# Patient Record
Sex: Female | Born: 1940 | Race: Black or African American | Hispanic: No | Smoking: Never smoker
Health system: Southern US, Community
[De-identification: ages and names within clinical notes are randomized; demographics above are authoritative.]

## PROBLEM LIST (undated history)

## (undated) DIAGNOSIS — G43909 Migraine, unspecified, not intractable, without status migrainosus: Secondary | ICD-10-CM

## (undated) DIAGNOSIS — E785 Hyperlipidemia, unspecified: Secondary | ICD-10-CM

## (undated) DIAGNOSIS — G4733 Obstructive sleep apnea (adult) (pediatric): Secondary | ICD-10-CM

## (undated) DIAGNOSIS — N183 Chronic kidney disease, stage 3 unspecified: Secondary | ICD-10-CM

## (undated) DIAGNOSIS — I10 Essential (primary) hypertension: Secondary | ICD-10-CM

## (undated) DIAGNOSIS — M109 Gout, unspecified: Secondary | ICD-10-CM

## (undated) DIAGNOSIS — D649 Anemia, unspecified: Secondary | ICD-10-CM

## (undated) DIAGNOSIS — G459 Transient cerebral ischemic attack, unspecified: Secondary | ICD-10-CM

## (undated) DIAGNOSIS — E119 Type 2 diabetes mellitus without complications: Secondary | ICD-10-CM

## (undated) DIAGNOSIS — E669 Obesity, unspecified: Secondary | ICD-10-CM

## (undated) DIAGNOSIS — M1990 Unspecified osteoarthritis, unspecified site: Secondary | ICD-10-CM

## (undated) DIAGNOSIS — G4730 Sleep apnea, unspecified: Secondary | ICD-10-CM

## (undated) DIAGNOSIS — F32A Depression, unspecified: Secondary | ICD-10-CM

## (undated) DIAGNOSIS — F329 Major depressive disorder, single episode, unspecified: Secondary | ICD-10-CM

## (undated) DIAGNOSIS — N289 Disorder of kidney and ureter, unspecified: Secondary | ICD-10-CM

## (undated) DIAGNOSIS — K219 Gastro-esophageal reflux disease without esophagitis: Secondary | ICD-10-CM

## (undated) DIAGNOSIS — I509 Heart failure, unspecified: Secondary | ICD-10-CM

## (undated) HISTORY — PX: ABDOMINAL HYSTERECTOMY: SHX81

## (undated) HISTORY — PX: JOINT REPLACEMENT: SHX530

## (undated) HISTORY — PX: REPLACEMENT TOTAL KNEE BILATERAL: SUR1225

## (undated) HISTORY — PX: CHOLECYSTECTOMY: SHX55

---

## 2012-05-29 ENCOUNTER — Emergency Department (HOSPITAL_BASED_OUTPATIENT_CLINIC_OR_DEPARTMENT_OTHER): Payer: Medicare Other

## 2012-05-29 ENCOUNTER — Emergency Department (HOSPITAL_BASED_OUTPATIENT_CLINIC_OR_DEPARTMENT_OTHER)
Admission: EM | Admit: 2012-05-29 | Discharge: 2012-05-29 | Disposition: A | Discharge status: Home / Self Care | Payer: Medicare Other | Attending: Emergency Medicine | Admitting: Emergency Medicine

## 2012-05-29 ENCOUNTER — Encounter (HOSPITAL_BASED_OUTPATIENT_CLINIC_OR_DEPARTMENT_OTHER): Payer: Self-pay | Admitting: Family Medicine

## 2012-05-29 DIAGNOSIS — F329 Major depressive disorder, single episode, unspecified: Secondary | ICD-10-CM | POA: Insufficient documentation

## 2012-05-29 DIAGNOSIS — Z79899 Other long term (current) drug therapy: Secondary | ICD-10-CM | POA: Insufficient documentation

## 2012-05-29 DIAGNOSIS — I129 Hypertensive chronic kidney disease with stage 1 through stage 4 chronic kidney disease, or unspecified chronic kidney disease: Secondary | ICD-10-CM | POA: Insufficient documentation

## 2012-05-29 DIAGNOSIS — M109 Gout, unspecified: Secondary | ICD-10-CM | POA: Insufficient documentation

## 2012-05-29 DIAGNOSIS — I509 Heart failure, unspecified: Secondary | ICD-10-CM | POA: Insufficient documentation

## 2012-05-29 DIAGNOSIS — E669 Obesity, unspecified: Secondary | ICD-10-CM | POA: Insufficient documentation

## 2012-05-29 DIAGNOSIS — F3289 Other specified depressive episodes: Secondary | ICD-10-CM | POA: Insufficient documentation

## 2012-05-29 DIAGNOSIS — Z8739 Personal history of other diseases of the musculoskeletal system and connective tissue: Secondary | ICD-10-CM | POA: Insufficient documentation

## 2012-05-29 DIAGNOSIS — Z8719 Personal history of other diseases of the digestive system: Secondary | ICD-10-CM | POA: Insufficient documentation

## 2012-05-29 DIAGNOSIS — J159 Unspecified bacterial pneumonia: Secondary | ICD-10-CM | POA: Insufficient documentation

## 2012-05-29 DIAGNOSIS — Z87448 Personal history of other diseases of urinary system: Secondary | ICD-10-CM | POA: Insufficient documentation

## 2012-05-29 DIAGNOSIS — D649 Anemia, unspecified: Secondary | ICD-10-CM | POA: Insufficient documentation

## 2012-05-29 DIAGNOSIS — R0789 Other chest pain: Secondary | ICD-10-CM | POA: Insufficient documentation

## 2012-05-29 DIAGNOSIS — Z8673 Personal history of transient ischemic attack (TIA), and cerebral infarction without residual deficits: Secondary | ICD-10-CM | POA: Insufficient documentation

## 2012-05-29 DIAGNOSIS — N183 Chronic kidney disease, stage 3 unspecified: Secondary | ICD-10-CM | POA: Insufficient documentation

## 2012-05-29 DIAGNOSIS — E119 Type 2 diabetes mellitus without complications: Secondary | ICD-10-CM | POA: Insufficient documentation

## 2012-05-29 DIAGNOSIS — Z8669 Personal history of other diseases of the nervous system and sense organs: Secondary | ICD-10-CM | POA: Insufficient documentation

## 2012-05-29 DIAGNOSIS — J189 Pneumonia, unspecified organism: Secondary | ICD-10-CM

## 2012-05-29 DIAGNOSIS — R0602 Shortness of breath: Secondary | ICD-10-CM | POA: Insufficient documentation

## 2012-05-29 HISTORY — DX: Major depressive disorder, single episode, unspecified: F32.9

## 2012-05-29 HISTORY — DX: Gastro-esophageal reflux disease without esophagitis: K21.9

## 2012-05-29 HISTORY — DX: Depression, unspecified: F32.A

## 2012-05-29 HISTORY — DX: Disorder of kidney and ureter, unspecified: N28.9

## 2012-05-29 HISTORY — DX: Heart failure, unspecified: I50.9

## 2012-05-29 HISTORY — DX: Unspecified osteoarthritis, unspecified site: M19.90

## 2012-05-29 HISTORY — DX: Essential (primary) hypertension: I10

## 2012-05-29 HISTORY — DX: Obesity, unspecified: E66.9

## 2012-05-29 HISTORY — DX: Type 2 diabetes mellitus without complications: E11.9

## 2012-05-29 HISTORY — DX: Anemia, unspecified: D64.9

## 2012-05-29 HISTORY — DX: Gout, unspecified: M10.9

## 2012-05-29 HISTORY — DX: Chronic kidney disease, stage 3 unspecified: N18.30

## 2012-05-29 HISTORY — DX: Migraine, unspecified, not intractable, without status migrainosus: G43.909

## 2012-05-29 HISTORY — DX: Chronic kidney disease, stage 3 (moderate): N18.3

## 2012-05-29 HISTORY — DX: Obstructive sleep apnea (adult) (pediatric): G47.33

## 2012-05-29 HISTORY — DX: Sleep apnea, unspecified: G47.30

## 2012-05-29 HISTORY — DX: Transient cerebral ischemic attack, unspecified: G45.9

## 2012-05-29 LAB — CBC WITH DIFFERENTIAL/PLATELET
Basophils Relative: 0 % (ref 0–1)
Eosinophils Absolute: 0.2 10*3/uL (ref 0.0–0.7)
Eosinophils Relative: 3 % (ref 0–5)
Hemoglobin: 10.5 g/dL — ABNORMAL LOW (ref 12.0–15.0)
Lymphs Abs: 1.5 10*3/uL (ref 0.7–4.0)
MCH: 29.2 pg (ref 26.0–34.0)
MCHC: 31.8 g/dL (ref 30.0–36.0)
MCV: 91.7 fL (ref 78.0–100.0)
Monocytes Relative: 12 % (ref 3–12)
Neutrophils Relative %: 62 % (ref 43–77)
Platelets: 219 10*3/uL (ref 150–400)
RBC: 3.6 MIL/uL — ABNORMAL LOW (ref 3.87–5.11)

## 2012-05-29 LAB — COMPREHENSIVE METABOLIC PANEL
Albumin: 3.5 g/dL (ref 3.5–5.2)
BUN: 22 mg/dL (ref 6–23)
Calcium: 9.1 mg/dL (ref 8.4–10.5)
GFR calc Af Amer: 39 mL/min — ABNORMAL LOW (ref >90)
Glucose, Bld: 167 mg/dL — ABNORMAL HIGH (ref 70–99)
Potassium: 4.4 mEq/L (ref 3.5–5.1)
Total Protein: 7.5 g/dL (ref 6.0–8.3)

## 2012-05-29 LAB — POCT I-STAT 3, ART BLOOD GAS (G3+)
pCO2 arterial: 41.3 mmHg (ref 35.0–45.0)
pH, Arterial: 7.392 (ref 7.350–7.450)

## 2012-05-29 LAB — GLUCOSE, CAPILLARY: Glucose-Capillary: 141 mg/dL — ABNORMAL HIGH (ref 70–99)

## 2012-05-29 LAB — TROPONIN I: Troponin I: <0.3 ng/mL (ref <0.30)

## 2012-05-29 LAB — PRO B NATRIURETIC PEPTIDE: Pro B Natriuretic peptide (BNP): 266 pg/mL — ABNORMAL HIGH (ref 0–125)

## 2012-05-29 MED ORDER — LEVOFLOXACIN 500 MG PO TABS: 500.0000 mg | ORAL_TABLET | Freq: Every day | ORAL | Status: DC

## 2012-05-29 MED ORDER — LEVOFLOXACIN IN D5W 500 MG/100ML IV SOLN
500.0000 mg | Freq: Once | INTRAVENOUS | Status: AC
  Administered 2012-05-29: 500 mg via INTRAVENOUS
  Filled 2012-05-29: qty 100

## 2012-05-29 NOTE — ED Notes (Signed)
Patient ambulated in hall.  HR 65-74, RR 18-24, SpO2 during walk ranged from 93-96% on room air.  Patient SpO2 dropped to 88% once back in room. +DOE

## 2012-05-29 NOTE — ED Notes (Signed)
Pt c/o productive cough x 3 days with a "tightness" in chest worse with deep inspiration and cough.

## 2012-05-29 NOTE — ED Provider Notes (Signed)
History     CSN: [id number]  Arrival date & time 05/29/12  1109   First MD Initiated Contact with Patient 05/29/12 1132      Chief Complaint  Patient presents with  . Cough    (Consider location/radiation/quality/duration/timing/severity/associated sxs/prior treatment) HPI Comments: 3 day history of productive cough, tightness in chest, worse with coughing.  States cough is productive of yellow mucus.  Endorses history of diastolic CHF, CKD, anemia, HTN.  Denies any leg pain or swelling.  Endorses sneezing, upper airway congestion and cough.  States her breathing is at baseline. Denies chest pain.  Some tenderness to palpation of chest wall. Good PO intake and urine output. Patient sleeps on 3 pillows at baseline uses CPAP for sleep apnea.  She has chronic DOE with stairs that is unchanged.  The history is provided by the patient and a relative.    Past Medical History  Diagnosis Date  . Obstructive sleep apnea   . Diabetes mellitus without complication   . CHF (congestive heart failure)   . Renal disorder   . Anemia   . Chronic kidney disease (CKD), stage III (moderate)   . Arthritis   . Obesity   . Degenerative joint disease   . Depression   . Esophageal reflux   . Migraine   . Gout   . HTN (hypertension)   . TIA (transient ischemic attack)   . Sleep apnea     Past Surgical History  Procedure Laterality Date  . Abdominal hysterectomy    . Cholecystectomy    . Joint replacement    . Replacement total knee bilateral      No family history on file.  History  Substance Use Topics  . Smoking status: Never Smoker   . Smokeless tobacco: Not on file  . Alcohol Use: No    OB History   Grav Para Term Preterm Abortions TAB SAB Ect Mult Living                  Review of Systems  Constitutional: Negative for fever, activity change and appetite change.  HENT: Negative for congestion and rhinorrhea.   Respiratory: Positive for cough, chest tightness and shortness  of breath.   Cardiovascular: Negative for chest pain and leg swelling.  Gastrointestinal: Negative for nausea, vomiting and abdominal pain.  Genitourinary: Negative for dysuria, hematuria, vaginal bleeding and vaginal discharge.  Musculoskeletal: Negative for back pain.  Skin: Negative for rash.  Neurological: Negative for dizziness, weakness and headaches.  A complete 10 system review of systems was obtained and all systems are negative except as noted in the HPI and PMH.    Allergies  Phentermine  Home Medications   Current Outpatient Rx  Name  Route  Sig  Dispense  Refill  . allopurinol (ZYLOPRIM) 300 MG tablet   Oral   Take 300 mg by mouth daily.         Marland Kitchen amLODipine (NORVASC) 10 MG tablet   Oral   Take 10 mg by mouth daily.         . cloNIDine (CATAPRES) 0.3 MG tablet   Oral   Take 0.3 mg by mouth 2 (two) times daily.         . clopidogrel (PLAVIX) 75 MG tablet   Oral   Take 75 mg by mouth daily.         . ergocalciferol (VITAMIN D2) 50000 UNITS capsule   Oral   Take 50,000 Units by mouth once a week.         Marland Kitchen  furosemide (LASIX) 20 MG tablet   Oral   Take 20 mg by mouth 2 (two) times daily.         Marland Kitchen glimepiride (AMARYL) 1 MG tablet   Oral   Take 1 mg by mouth daily before breakfast.         . HYDROcodone-acetaminophen (NORCO) 7.5-325 MG per tablet   Oral   Take 1 tablet by mouth every 6 (six) hours as needed for pain.         . iron polysaccharides (NIFEREX) 150 MG capsule   Oral   Take 150 mg by mouth 2 (two) times daily.         . isosorbide mononitrate (IMDUR) 30 MG 24 hr tablet   Oral   Take 30 mg by mouth daily.         Marland Kitchen losartan (COZAAR) 50 MG tablet   Oral   Take 50 mg by mouth daily.         . nebivolol (BYSTOLIC) 5 MG tablet   Oral   Take 5 mg by mouth daily.           BP 148/69  Pulse 65  Temp(Src) 97.9 F (36.6 C) (Oral)  Resp 20  Ht 5\' 2"  (1.575 m)  Wt 289 lb (131.09 kg)  BMI 52.85 kg/m2  SpO2  97%  Physical Exam  Constitutional: She is oriented to person, place, and time. She appears well-developed and well-nourished. No distress.  Speaking in full sentences  HENT:  Head: Normocephalic and atraumatic.  Mouth/Throat: Oropharynx is clear and moist. No oropharyngeal exudate.  Eyes: Conjunctivae and EOM are normal. Pupils are equal, round, and reactive to light.  Neck: Normal range of motion. Neck supple.  Cardiovascular: Normal rate, regular rhythm and normal heart sounds.   No murmur heard. Pulmonary/Chest: Effort normal and breath sounds normal. No respiratory distress. She exhibits no tenderness.  No rhonchi or wheezing  Abdominal: Soft. There is no tenderness. There is no rebound and no guarding.  Musculoskeletal: Normal range of motion. She exhibits no edema and no tenderness.  No pedal edema  Neurological: She is alert and oriented to person, place, and time. No cranial nerve deficit. She exhibits normal muscle tone. Coordination normal.  Skin: Skin is warm.    ED Course  Procedures (including critical care time)  Labs Reviewed  CBC WITH DIFFERENTIAL - Abnormal; Notable for the following:    RBC 3.60 (*)    Hemoglobin 10.5 (*)    HCT 33.0 (*)    All other components within normal limits  COMPREHENSIVE METABOLIC PANEL - Abnormal; Notable for the following:    Glucose, Bld 167 (*)    Creatinine, Ser 1.50 (*)    GFR calc non Af Amer 34 (*)    GFR calc Af Amer 39 (*)    All other components within normal limits  PRO B NATRIURETIC PEPTIDE - Abnormal; Notable for the following:    Pro B Natriuretic peptide (BNP) 266.0 (*)    All other components within normal limits  TROPONIN I   Dg Chest 2 View  05/29/2012   *RADIOLOGY REPORT*  Clinical Data: Cough, congestion  CHEST - 2 VIEW  Comparison: None.  Findings: Borderline cardiomegaly.  Mild elevation of the right hemidiaphragm.  Subtle right basilar atelectasis or infiltrate. No pulmonary edema.  Small area of focal  atelectasis or infiltrate in the right perihilar region.  Follow-up to resolution is recommended.  IMPRESSION: Subtle right basilar atelectasis or infiltrate. No  pulmonary edema.  Small area of focal atelectasis or infiltrate in the right perihilar region.  Follow-up to resolution is recommended.   Original Report Authenticated By: Natasha Mead, M.D.     No diagnosis found.    MDM  3 day history of productive cough with history of chest tightness and pain with coughing. No fevers. Good by mouth intake and urine output.  X-ray findings are consistent with pneumonia. Patient is afebrile not hypoxic. EKG is normal sinus rhythm, troponin is negative. There is no evidence of pulmonary edema or overt heart failure. Patient's ambulatory without desaturation  [facility]. Her creatinine is at baseline. Her hemoglobin is at baseline. She has a history of CHF, diastolic dysfunction, anemia and occasional receive Aranesp. She uses Lasix on an as-needed basis. Negative stress tests in 2011 and 2012.   patient wishes to go home. She states she feels her breathing is at baseline. Her port score is 82. She has PCP followup this week. D/w patient's PCP Molli Barrows NP who agrees to see patient tomorrow in office. Patient and daughter comfortable with plan and know to return with worsening symptoms.   Date: 05/29/2012  Rate: 63  Rhythm: normal sinus rhythm  QRS Axis: left  Intervals: normal  ST/T Wave abnormalities: normal  Conduction Disutrbances:none  Narrative Interpretation:   Old EKG Reviewed: none available    Glynn Octave, MD 05/29/12 1528

## 2012-05-31 DIAGNOSIS — I10 Essential (primary) hypertension: Secondary | ICD-10-CM | POA: Insufficient documentation

## 2012-12-27 ENCOUNTER — Encounter (HOSPITAL_BASED_OUTPATIENT_CLINIC_OR_DEPARTMENT_OTHER): Payer: Self-pay | Admitting: Emergency Medicine

## 2012-12-27 ENCOUNTER — Emergency Department (HOSPITAL_BASED_OUTPATIENT_CLINIC_OR_DEPARTMENT_OTHER): Payer: Medicare Other

## 2012-12-27 ENCOUNTER — Emergency Department (HOSPITAL_BASED_OUTPATIENT_CLINIC_OR_DEPARTMENT_OTHER)
Admission: EM | Admit: 2012-12-27 | Discharge: 2012-12-27 | Disposition: A | Discharge status: Home / Self Care | Payer: Medicare Other | Attending: Emergency Medicine | Admitting: Emergency Medicine

## 2012-12-27 DIAGNOSIS — R509 Fever, unspecified: Secondary | ICD-10-CM

## 2012-12-27 DIAGNOSIS — Z8673 Personal history of transient ischemic attack (TIA), and cerebral infarction without residual deficits: Secondary | ICD-10-CM | POA: Insufficient documentation

## 2012-12-27 DIAGNOSIS — K219 Gastro-esophageal reflux disease without esophagitis: Secondary | ICD-10-CM | POA: Insufficient documentation

## 2012-12-27 DIAGNOSIS — E669 Obesity, unspecified: Secondary | ICD-10-CM | POA: Insufficient documentation

## 2012-12-27 DIAGNOSIS — Z7902 Long term (current) use of antithrombotics/antiplatelets: Secondary | ICD-10-CM | POA: Insufficient documentation

## 2012-12-27 DIAGNOSIS — R350 Frequency of micturition: Secondary | ICD-10-CM | POA: Insufficient documentation

## 2012-12-27 DIAGNOSIS — E119 Type 2 diabetes mellitus without complications: Secondary | ICD-10-CM | POA: Insufficient documentation

## 2012-12-27 DIAGNOSIS — D649 Anemia, unspecified: Secondary | ICD-10-CM | POA: Insufficient documentation

## 2012-12-27 DIAGNOSIS — F329 Major depressive disorder, single episode, unspecified: Secondary | ICD-10-CM | POA: Insufficient documentation

## 2012-12-27 DIAGNOSIS — J069 Acute upper respiratory infection, unspecified: Secondary | ICD-10-CM | POA: Insufficient documentation

## 2012-12-27 DIAGNOSIS — G43909 Migraine, unspecified, not intractable, without status migrainosus: Secondary | ICD-10-CM | POA: Insufficient documentation

## 2012-12-27 DIAGNOSIS — N183 Chronic kidney disease, stage 3 unspecified: Secondary | ICD-10-CM | POA: Insufficient documentation

## 2012-12-27 DIAGNOSIS — F3289 Other specified depressive episodes: Secondary | ICD-10-CM | POA: Insufficient documentation

## 2012-12-27 DIAGNOSIS — I509 Heart failure, unspecified: Secondary | ICD-10-CM | POA: Insufficient documentation

## 2012-12-27 DIAGNOSIS — I129 Hypertensive chronic kidney disease with stage 1 through stage 4 chronic kidney disease, or unspecified chronic kidney disease: Secondary | ICD-10-CM | POA: Insufficient documentation

## 2012-12-27 DIAGNOSIS — R52 Pain, unspecified: Secondary | ICD-10-CM | POA: Insufficient documentation

## 2012-12-27 DIAGNOSIS — M1990 Unspecified osteoarthritis, unspecified site: Secondary | ICD-10-CM | POA: Insufficient documentation

## 2012-12-27 DIAGNOSIS — Z79899 Other long term (current) drug therapy: Secondary | ICD-10-CM | POA: Insufficient documentation

## 2012-12-27 DIAGNOSIS — M109 Gout, unspecified: Secondary | ICD-10-CM | POA: Insufficient documentation

## 2012-12-27 LAB — URINE MICROSCOPIC-ADD ON

## 2012-12-27 LAB — URINALYSIS, ROUTINE W REFLEX MICROSCOPIC
Hgb urine dipstick: NEGATIVE
Nitrite: NEGATIVE
Specific Gravity, Urine: 1.021 (ref 1.005–1.030)
Urobilinogen, UA: 0.2 mg/dL (ref 0.0–1.0)
pH: 6 (ref 5.0–8.0)

## 2012-12-27 LAB — CG4 I-STAT (LACTIC ACID): Lactic Acid, Venous: 1.04 mmol/L (ref 0.5–2.2)

## 2012-12-27 LAB — CBC WITH DIFFERENTIAL/PLATELET
Eosinophils Absolute: 0.1 10*3/uL (ref 0.0–0.7)
Hemoglobin: 11.4 g/dL — ABNORMAL LOW (ref 12.0–15.0)
Lymphocytes Relative: 16 % (ref 12–46)
Lymphs Abs: 2 10*3/uL (ref 0.7–4.0)
MCH: 30.5 pg (ref 26.0–34.0)
Neutro Abs: 8.3 10*3/uL — ABNORMAL HIGH (ref 1.7–7.7)
Neutrophils Relative %: 69 % (ref 43–77)
Platelets: 212 10*3/uL (ref 150–400)
RBC: 3.74 MIL/uL — ABNORMAL LOW (ref 3.87–5.11)
WBC: 12.1 10*3/uL — ABNORMAL HIGH (ref 4.0–10.5)

## 2012-12-27 LAB — BASIC METABOLIC PANEL
Chloride: 102 mEq/L (ref 96–112)
GFR calc Af Amer: 36 mL/min — ABNORMAL LOW (ref >90)
GFR calc non Af Amer: 31 mL/min — ABNORMAL LOW (ref >90)
Glucose, Bld: 145 mg/dL — ABNORMAL HIGH (ref 70–99)
Potassium: 5.1 mEq/L (ref 3.5–5.1)
Sodium: 136 mEq/L (ref 135–145)

## 2012-12-27 MED ORDER — SODIUM CHLORIDE 0.9 % IV SOLN
Freq: Once | INTRAVENOUS | Status: AC
  Administered 2012-12-27: 13:00:00 via INTRAVENOUS

## 2012-12-27 MED ORDER — ACETAMINOPHEN 325 MG PO TABS
650.0000 mg | ORAL_TABLET | Freq: Once | ORAL | Status: AC
  Administered 2012-12-27: 650 mg via ORAL
  Filled 2012-12-27: qty 2

## 2012-12-27 MED ORDER — HYDROCOD POLST-CHLORPHEN POLST 10-8 MG/5ML PO LQCR: 5.0000 mL | Freq: Two times a day (BID) | ORAL | Status: DC | PRN

## 2012-12-27 MED ORDER — SODIUM CHLORIDE 0.9 % IV BOLUS (SEPSIS)
250.0000 mL | Freq: Once | INTRAVENOUS | Status: AC
  Administered 2012-12-27: 250 mL via INTRAVENOUS

## 2012-12-27 NOTE — ED Provider Notes (Signed)
CSN: [id number]     Arrival date & time 12/27/12  1136 History   First MD Initiated Contact with Patient 12/27/12 1207     Chief Complaint  Patient presents with  . Fever  . Generalized Body Aches   (Consider location/radiation/quality/duration/timing/severity/associated sxs/prior Treatment) Patient is a 72 y.o. female presenting with fever. The history is provided by the patient. No language interpreter was used.  Fever Severity:  Moderate Onset quality:  Gradual Duration:  3 days Associated symptoms: congestion and cough   Associated symptoms: no chest pain, no chills, no confusion, no dysuria, no nausea and no vomiting   Associated symptoms comment:  Cough that is sometimes productive, nasal congestion, sinus pressure and fever with night time chills for the past 2-3 days. No nausea, vomiting, abdominal pain, chest pain, dysuria. She does report increased urinary frequency but also states increased water intake since being sick.    Past Medical History  Diagnosis Date  . Obstructive sleep apnea   . Diabetes mellitus without complication   . CHF (congestive heart failure)   . Renal disorder   . Anemia   . Chronic kidney disease (CKD), stage III (moderate)   . Arthritis   . Obesity   . Degenerative joint disease   . Depression   . Esophageal reflux   . Migraine   . Gout   . HTN (hypertension)   . TIA (transient ischemic attack)   . Sleep apnea    Past Surgical History  Procedure Laterality Date  . Abdominal hysterectomy    . Cholecystectomy    . Joint replacement    . Replacement total knee bilateral     No family history on file. History  Substance Use Topics  . Smoking status: Never Smoker   . Smokeless tobacco: Not on file  . Alcohol Use: No   OB History   Grav Para Term Preterm Abortions TAB SAB Ect Mult Living                 Review of Systems  Constitutional: Positive for fever. Negative for chills.  HENT: Positive for congestion and sinus pressure.    Eyes: Negative for discharge.  Respiratory: Positive for cough. Negative for chest tightness and shortness of breath.   Cardiovascular: Negative.  Negative for chest pain.  Gastrointestinal: Negative.  Negative for nausea, vomiting and abdominal pain.  Genitourinary: Positive for frequency. Negative for dysuria.  Musculoskeletal: Negative.   Skin: Negative.   Neurological: Negative.   Psychiatric/Behavioral: Negative for confusion.    Allergies  Phentermine  Home Medications   Current Outpatient Rx  Name  Route  Sig  Dispense  Refill  . allopurinol (ZYLOPRIM) 300 MG tablet   Oral   Take 300 mg by mouth daily.         Marland Kitchen amLODipine (NORVASC) 10 MG tablet   Oral   Take 10 mg by mouth daily.         . cloNIDine (CATAPRES) 0.3 MG tablet   Oral   Take 0.3 mg by mouth 2 (two) times daily.         . clopidogrel (PLAVIX) 75 MG tablet   Oral   Take 75 mg by mouth daily.         . ergocalciferol (VITAMIN D2) 50000 UNITS capsule   Oral   Take 50,000 Units by mouth once a week.         . furosemide (LASIX) 20 MG tablet   Oral   Take 20  mg by mouth 2 (two) times daily.         Marland Kitchen glimepiride (AMARYL) 1 MG tablet   Oral   Take 1 mg by mouth daily before breakfast.         . iron polysaccharides (NIFEREX) 150 MG capsule   Oral   Take 150 mg by mouth 2 (two) times daily.         . isosorbide mononitrate (IMDUR) 30 MG 24 hr tablet   Oral   Take 30 mg by mouth daily.         Marland Kitchen losartan (COZAAR) 50 MG tablet   Oral   Take 50 mg by mouth daily.         . nebivolol (BYSTOLIC) 5 MG tablet   Oral   Take 5 mg by mouth daily.         Marland Kitchen HYDROcodone-acetaminophen (NORCO) 7.5-325 MG per tablet   Oral   Take 1 tablet by mouth every 6 (six) hours as needed for pain.         Marland Kitchen levofloxacin (LEVAQUIN) 500 MG tablet   Oral   Take 1 tablet (500 mg total) by mouth daily.   7 tablet   0    BP 103/56  Pulse 93  Temp(Src) 102.1 F (38.9 C) (Oral)  Resp  22  Ht 5\' 2"  (1.575 m)  Wt 283 lb (128.368 kg)  BMI 51.75 kg/m2  SpO2 98% Physical Exam  Constitutional: She is oriented to person, place, and time. She appears well-developed and well-nourished.  HENT:  Head: Normocephalic.  Right Ear: External ear normal.  Left Ear: External ear normal.  Nose: Mucosal edema present. Right sinus exhibits frontal sinus tenderness. Left sinus exhibits frontal sinus tenderness.  Mouth/Throat: Oropharynx is clear and moist.  Eyes: Conjunctivae are normal.  Neck: Normal range of motion. Neck supple.  Cardiovascular: Normal rate and normal heart sounds.   No murmur heard. Pulmonary/Chest: Effort normal and breath sounds normal. She has no wheezes. She has no rales. She exhibits no tenderness.  Abdominal: Soft. Bowel sounds are normal. She exhibits no distension. There is no tenderness.  Musculoskeletal: Normal range of motion.  Lymphadenopathy:    She has no cervical adenopathy.  Neurological: She is alert and oriented to person, place, and time. No cranial nerve deficit.  Skin: Skin is warm and dry. No pallor.  Psychiatric: She has a normal mood and affect.    ED Course  Procedures (including critical care time) Labs Review Labs Reviewed  URINE CULTURE  CULTURE, BLOOD (ROUTINE X 2)  CULTURE, BLOOD (ROUTINE X 2)  CBC WITH DIFFERENTIAL  BASIC METABOLIC PANEL  URINALYSIS, ROUTINE W REFLEX MICROSCOPIC   Results for orders placed during the hospital encounter of 12/27/12  CBC WITH DIFFERENTIAL      Result Value Range   WBC 12.1 (*) 4.0 - 10.5 K/uL   RBC 3.74 (*) 3.87 - 5.11 MIL/uL   Hemoglobin 11.4 (*) 12.0 - 15.0 g/dL   HCT 40.3 (*) 47.4 - 25.9 %   MCV 90.4  78.0 - 100.0 fL   MCH 30.5  26.0 - 34.0 pg   MCHC 33.7  30.0 - 36.0 g/dL   RDW 56.3  87.5 - 64.3 %   Platelets 212  150 - 400 K/uL   Neutrophils Relative % 69  43 - 77 %   Neutro Abs 8.3 (*) 1.7 - 7.7 K/uL   Lymphocytes Relative 16  12 - 46 %   Lymphs Abs 2.0  0.7 - 4.0 K/uL    Monocytes Relative 14 (*) 3 - 12 %   Monocytes Absolute 1.7 (*) 0.1 - 1.0 K/uL   Eosinophils Relative 1  0 - 5 %   Eosinophils Absolute 0.1  0.0 - 0.7 K/uL   Basophils Relative 0  0 - 1 %   Basophils Absolute 0.0  0.0 - 0.1 K/uL  BASIC METABOLIC PANEL      Result Value Range   Sodium 136  135 - 145 mEq/L   Potassium 5.1  3.5 - 5.1 mEq/L   Chloride 102  96 - 112 mEq/L   CO2 23  19 - 32 mEq/L   Glucose, Bld 145 (*) 70 - 99 mg/dL   BUN 23  6 - 23 mg/dL   Creatinine, Ser 1.61 (*) 0.50 - 1.10 mg/dL   Calcium 9.0  8.4 - 09.6 mg/dL   GFR calc non Af Amer 31 (*) >90 mL/min   GFR calc Af Amer 36 (*) >90 mL/min  URINALYSIS, ROUTINE W REFLEX MICROSCOPIC      Result Value Range   Color, Urine YELLOW  YELLOW   APPearance CLOUDY (*) CLEAR   Specific Gravity, Urine 1.021  1.005 - 1.030   pH 6.0  5.0 - 8.0   Glucose, UA NEGATIVE  NEGATIVE mg/dL   Hgb urine dipstick NEGATIVE  NEGATIVE   Bilirubin Urine NEGATIVE  NEGATIVE   Ketones, ur NEGATIVE  NEGATIVE mg/dL   Protein, ur 30 (*) NEGATIVE mg/dL   Urobilinogen, UA 0.2  0.0 - 1.0 mg/dL   Nitrite NEGATIVE  NEGATIVE   Leukocytes, UA NEGATIVE  NEGATIVE  URINE MICROSCOPIC-ADD ON      Result Value Range   Squamous Epithelial / LPF RARE  RARE   WBC, UA 3-6  <3 WBC/hpf   RBC / HPF 0-2  <3 RBC/hpf   Bacteria, UA MANY (*) RARE   Casts GRANULAR CAST (*) NEGATIVE   Urine-Other MICROSCOPIC EXAM PERFORMED ON UNCONCENTRATED URINE    CG4 I-STAT (LACTIC ACID)      Result Value Range   Lactic Acid, Venous 1.04  0.5 - 2.2 mmol/L   Dg Chest 2 View  12/27/2012   CLINICAL DATA:  Cough, fever.  EXAM: CHEST  2 VIEW  COMPARISON:  May 29, 2012.  FINDINGS: Stable mild cardiomegaly. No pleural effusion or pneumothorax is noted. No acute pulmonary disease is noted. Bony thorax is intact.  IMPRESSION: No acute cardiopulmonary abnormality seen.   Electronically Signed   By: Roque Lias M.D.   On: 12/27/2012 13:50   Imaging Review No results found.  EKG  Interpretation   None       MDM  No diagnosis found. 1. URI 2. Febrile illness  She has tolerated oral fluids and solids. Given small bolus with minimal improvement in how she feels. No evidence of a bacterial infection present. VSS, fever improved. Discussed supportive care at home along with Rx cough medication.     Arnoldo Hooker, PA-C 12/29/12 1517

## 2012-12-27 NOTE — ED Provider Notes (Signed)
Medical screening examination/treatment/procedure(s) were conducted as a shared visit with non-physician practitioner(s) or resident  and myself.  I personally evaluated the patient during the encounter and agree with the findings and plan unless otherwise indicated.    I have personally reviewed any xrays and/ or EKG's with the provider and I agree with interpretation.   Recent flu sxs- cough, fever, chills for 3 days.  No current abx.  CHF hx.  No sick contacts.  Productive sputum. Exam lungs clear, no distress, RRR, mild leg edema bilateral, well appearing.  Pneumonia vs influenza/ viral.  Not requiring O2.  Plan for small fluid bolus and lactic acid. Dg Chest 2 View  12/27/2012   CLINICAL DATA:  Cough, fever.  EXAM: CHEST  2 VIEW  COMPARISON:  May 29, 2012.  FINDINGS: Stable mild cardiomegaly. No pleural effusion or pneumothorax is noted. No acute pulmonary disease is noted. Bony thorax is intact.  IMPRESSION: No acute cardiopulmonary abnormality seen.   Electronically Signed   By: Roque Lias M.D.   On: 12/27/2012 13:50   PO challenge.  Plan for recheck and likely close outpatient follow up. Repeat vitals. Signed out for recheck and final dispo after fluid bolus.    Cough, Fever  Enid Skeens, MD 12/29/12 1323

## 2012-12-27 NOTE — ED Notes (Signed)
Flu like symptoms for two days. Fever today without medications given at home. Productive cough of brownish sputum per patient.

## 2012-12-28 LAB — URINE CULTURE: Colony Count: 30000

## 2012-12-31 NOTE — ED Provider Notes (Signed)
Medical screening examination/treatment/procedure(s) were conducted as a shared visit with non-physician practitioner(s) and myself.  I personally evaluated the patient during the encounter.  EKG Interpretation   None      See separate note  Enid Skeens, MD 12/31/12 [phone]

## 2013-01-01 DIAGNOSIS — E785 Hyperlipidemia, unspecified: Secondary | ICD-10-CM | POA: Insufficient documentation

## 2013-01-02 LAB — CULTURE, BLOOD (ROUTINE X 2): Culture: NO GROWTH

## 2013-05-23 DIAGNOSIS — M549 Dorsalgia, unspecified: Secondary | ICD-10-CM | POA: Insufficient documentation

## 2013-05-23 DIAGNOSIS — F419 Anxiety disorder, unspecified: Secondary | ICD-10-CM | POA: Insufficient documentation

## 2013-06-06 DIAGNOSIS — N183 Chronic kidney disease, stage 3 unspecified: Secondary | ICD-10-CM | POA: Insufficient documentation

## 2015-01-12 ENCOUNTER — Emergency Department (INDEPENDENT_AMBULATORY_CARE_PROVIDER_SITE_OTHER)
Admission: EM | Admit: 2015-01-12 | Discharge: 2015-01-12 | Disposition: A | Discharge status: Home / Self Care | Payer: Medicare Other | Source: Home / Self Care | Attending: Emergency Medicine | Admitting: Emergency Medicine

## 2015-01-12 ENCOUNTER — Encounter: Payer: Self-pay | Admitting: *Deleted

## 2015-01-12 DIAGNOSIS — J0101 Acute recurrent maxillary sinusitis: Secondary | ICD-10-CM | POA: Diagnosis not present

## 2015-01-12 DIAGNOSIS — R05 Cough: Secondary | ICD-10-CM | POA: Diagnosis not present

## 2015-01-12 DIAGNOSIS — R059 Cough, unspecified: Secondary | ICD-10-CM

## 2015-01-12 HISTORY — DX: Hyperlipidemia, unspecified: E78.5

## 2015-01-12 MED ORDER — FLUTICASONE PROPIONATE 50 MCG/ACT NA SUSP: NASAL | Status: DC

## 2015-01-12 MED ORDER — PROMETHAZINE-CODEINE 6.25-10 MG/5ML PO SYRP: ORAL_SOLUTION | ORAL | Status: DC

## 2015-01-12 MED ORDER — BENZONATATE 200 MG PO CAPS: ORAL_CAPSULE | ORAL | Status: DC

## 2015-01-12 MED ORDER — CEFDINIR 300 MG PO CAPS: 300.0000 mg | ORAL_CAPSULE | Freq: Two times a day (BID) | ORAL | Status: DC

## 2015-01-12 NOTE — ED Notes (Signed)
Pt c/o nasal congestion and sneezing x 6 days, with productive cough x 2 days. Denies fever. Hx of pneumonia.

## 2015-01-12 NOTE — ED Provider Notes (Signed)
CSN: [id number]     Arrival date & time 01/12/15  1040 History   First MD Initiated Contact with Patient 01/12/15 1157     Chief Complaint  Patient presents with  . Cough   75 year old female presents here to [facility] with her daughter. HPI URI HISTORY  Tonya Kaufman is a 75 y.o. female who complains of onset of cough and cold symptoms for 6l days.--Progressively worsening  Have been using over-the-counter treatment and Flonase which helps a little bit.  No chills/sweats +  Low-grade Fever  +  Nasal congestion +  Discolored Post-nasal drainage No sinus pain/pressure No sore throat  +  Cough, occasionally productive of yellow sputum and No wheezing Positive chest congestion No hemoptysis Rarely has shortness of breath with exertion only. No pleuritic pain. Denies chest pain or palpitations. Denies syncope or any acute neurologic symptoms  No itchy/red eyes No earache  No nausea No vomiting No abdominal pain No diarrhea  No skin rashes +  Fatigue No myalgias No headache   I reviewed her past medical history below. She states her type 2 diabetes is controlled. She sees her PCP regularly. History of pneumonia in the past. She states that she has gotten recurrent sinus infections in the past.  Family history: Denies family history of chronic lung disease.  Past Medical History  Diagnosis Date  . Obstructive sleep apnea   . Diabetes mellitus without complication (HCC)   . CHF (congestive heart failure) (HCC)   . Renal disorder   . Anemia   . Chronic kidney disease (CKD), stage III (moderate)   . Arthritis   . Obesity   . Degenerative joint disease   . Depression   . Esophageal reflux   . Migraine   . Gout   . HTN (hypertension)   . TIA (transient ischemic attack)   . Sleep apnea   . Hyperlipidemia    Past Surgical History  Procedure Laterality Date  . Abdominal hysterectomy    . Cholecystectomy    . Joint replacement    . Replacement total knee  bilateral     History reviewed. No pertinent family history. Social History  Substance Use Topics  . Smoking status: Never Smoker   . Smokeless tobacco: None  . Alcohol Use: No   OB History    No data available     Review of Systems  All other systems reviewed and are negative.   Allergies  Phentermine  Home Medications   Prior to Admission medications   Medication Sig Start Date End Date Taking? Authorizing Provider  allopurinol (ZYLOPRIM) 300 MG tablet Take 300 mg by mouth daily.   Yes Historical Provider, MD  amLODipine (NORVASC) 10 MG tablet Take 10 mg by mouth daily.   Yes Historical Provider, MD  cloNIDine (CATAPRES) 0.3 MG tablet Take 0.3 mg by mouth 2 (two) times daily.   Yes Historical Provider, MD  clopidogrel (PLAVIX) 75 MG tablet Take 75 mg by mouth daily.   Yes Historical Provider, MD  ergocalciferol (VITAMIN D2) 50000 UNITS capsule Take 50,000 Units by mouth once a week.   Yes Historical Provider, MD  esomeprazole (NEXIUM) 40 MG capsule Take 40 mg by mouth daily at 12 noon.   Yes Historical Provider, MD  hydrALAZINE (APRESOLINE) 25 MG tablet Take 25 mg by mouth 3 (three) times daily.   Yes Historical Provider, MD  isosorbide mononitrate (IMDUR) 30 MG 24 hr tablet Take 30 mg by mouth daily.   Yes Historical Provider,  MD  losartan (COZAAR) 50 MG tablet Take 50 mg by mouth daily.   Yes Historical Provider, MD  nebivolol (BYSTOLIC) 5 MG tablet Take 5 mg by mouth daily.   Yes Historical Provider, MD  benzonatate (TESSALON) 200 MG capsule Take 1 every 8 hours as needed for cough. 01/12/15   Lajean Manes, MD  cefdinir (OMNICEF) 300 MG capsule Take 1 capsule (300 mg total) by mouth 2 (two) times daily. X 10 days 01/12/15   Lajean Manes, MD  fluticasone [facility]) 50 MCG/ACT nasal spray 1 or 2 sprays each nostril twice a day 01/12/15   Lajean Manes, MD  furosemide (LASIX) 20 MG tablet Take 20 mg by mouth 2 (two) times daily.    Historical Provider, MD  promethazine-codeine  (PHENERGAN WITH CODEINE) 6.25-10 MG/5ML syrup Take 1-2 teaspoons every 6 hours as needed for cough. Caution: May cause drowsiness. 01/12/15   Lajean Manes, MD   Meds Ordered and Administered this Visit  Medications - No data to display  BP 166/79 mmHg  Pulse 74  Temp(Src) 98.3 F (36.8 C) (Oral)  Resp 18  Ht 5\' 2"  (1.575 m)  Wt 286 lb (129.729 kg)  BMI 52.30 kg/m2  SpO2 96% No data found.   Physical Exam  Constitutional: She is oriented to person, place, and time. She appears well-developed and well-nourished. No distress.  HENT:  Head: Normocephalic and atraumatic.  Right Ear: Tympanic membrane, external ear and ear canal normal.  Left Ear: Tympanic membrane, external ear and ear canal normal.  Nose: Mucosal edema and rhinorrhea present. Right sinus exhibits maxillary sinus tenderness. Left sinus exhibits maxillary sinus tenderness.  Mouth/Throat: Oropharynx is clear and moist. No oral lesions. No oropharyngeal exudate.  Eyes: Right eye exhibits no discharge. Left eye exhibits no discharge. No scleral icterus.  Neck: Neck supple. No JVD present. No tracheal deviation present.  Cardiovascular: Normal rate, regular rhythm and normal heart sounds.   Pulmonary/Chest: Effort normal. No respiratory distress. She has no wheezes. She has no rales.  Lungs with few anterior rhonchi. No wheezes or rales.  Abdominal: She exhibits no distension.  Musculoskeletal: She exhibits no edema or tenderness.  Lymphadenopathy:    She has no cervical adenopathy.  Neurological: She is alert and oriented to person, place, and time. No cranial nerve deficit.  Skin: Skin is warm and dry. No rash noted.  Psychiatric: She has a normal mood and affect.  Nursing note and vitals reviewed.   ED Course  Procedures (including critical care time)  Labs Review Labs Reviewed - No data to display  Imaging Review No results found. We discussed the option of ordering chest x-ray, but she declined. I agree that  chest x-ray is not required at this time, as she only has minimal anterior rhonchi on auscultation and otherwise lung exam normal and auscultation without any rales or wheezes.  O2 saturation 96% on room air.  MDM   1. Acute recurrent maxillary sinusitis   2. Cough    Treatment options discussed, as well as risks, benefits, alternatives. Patient voiced understanding and agreement with the following plans:   New Prescriptions   BENZONATATE (TESSALON) 200 MG CAPSULE    Take 1 every 8 hours as needed for cough.   CEFDINIR (OMNICEF) 300 MG CAPSULE    Take 1 capsule (300 mg total) by mouth 2 (two) times daily. X 10 days   FLUTICASONE (FLONASE) 50 MCG/ACT NASAL SPRAY    1 or 2 sprays each nostril twice a day  PROMETHAZINE-CODEINE (PHENERGAN WITH CODEINE) 6.25-10 MG/5ML SYRUP    Take 1-2 teaspoons every 6 hours as needed for cough. Caution: May cause drowsiness.   other symptomatic care discussed. Follow-up with your primary care doctor in 5-7 days if not improving, or sooner if symptoms become worse. Precautions discussed. Red flags discussed. Questions invited and answered. Patient voiced understanding and agreement.     Lajean Manesavid Massey, MD 01/12/15 [phone]

## 2015-12-17 ENCOUNTER — Emergency Department (INDEPENDENT_AMBULATORY_CARE_PROVIDER_SITE_OTHER)
Admission: EM | Admit: 2015-12-17 | Discharge: 2015-12-17 | Disposition: A | Discharge status: Home / Self Care | Payer: Medicare Other | Source: Home / Self Care | Attending: Family Medicine | Admitting: Family Medicine

## 2015-12-17 DIAGNOSIS — B9789 Other viral agents as the cause of diseases classified elsewhere: Secondary | ICD-10-CM

## 2015-12-17 DIAGNOSIS — R300 Dysuria: Secondary | ICD-10-CM

## 2015-12-17 DIAGNOSIS — J069 Acute upper respiratory infection, unspecified: Secondary | ICD-10-CM

## 2015-12-17 LAB — POCT URINALYSIS DIP (MANUAL ENTRY)
Bilirubin, UA: NEGATIVE
Blood, UA: NEGATIVE
Glucose, UA: NEGATIVE
Ketones, POC UA: NEGATIVE
Leukocytes, UA: NEGATIVE
Nitrite, UA: NEGATIVE
Protein Ur, POC: 30 — AB
Spec Grav, UA: 1.025 (ref 1.005–1.03)
Urobilinogen, UA: 0.2 (ref 0–1)
pH, UA: 5.5 (ref 5–8)

## 2015-12-17 MED ORDER — BENZONATATE 100 MG PO CAPS: 100.0000 mg | ORAL_CAPSULE | Freq: Three times a day (TID) | ORAL | 0 refills | Status: DC

## 2015-12-17 NOTE — ED Triage Notes (Signed)
Patient c/o cough x 2 days. Last night temp of 100 with chills. This AM developed dysuria

## 2015-12-17 NOTE — ED Provider Notes (Signed)
CSN: [id number]     Arrival date & time 12/17/15  1112 History   First MD Initiated Contact with Patient 12/17/15 1142     Chief Complaint  Patient presents with  . Cough  . Dysuria   (Consider location/radiation/quality/duration/timing/severity/associated sxs/prior Treatment) HPI  Tonya Kaufman is a 75 y.o. female presenting to UC with c/o 2 days of nasal congestion and mildly productive cough and scratchy throat. She reports having chills last night with mild dysuria but notes she did not eat or drink much yesterday or today due to no feeling well. Denies n/v/d. Hx of UTI "many years ago" mild lower abdominal discomfort. Denies back pain.  Her friend was sick the other week.    Past Medical History:  Diagnosis Date  . Anemia   . Arthritis   . CHF (congestive heart failure) (HCC)   . Chronic kidney disease (CKD), stage III (moderate)   . Degenerative joint disease   . Depression   . Diabetes mellitus without complication (HCC)   . Esophageal reflux   . Gout   . HTN (hypertension)   . Hyperlipidemia   . Migraine   . Obesity   . Obstructive sleep apnea   . Renal disorder   . Sleep apnea   . TIA (transient ischemic attack)    Past Surgical History:  Procedure Laterality Date  . ABDOMINAL HYSTERECTOMY    . CHOLECYSTECTOMY    . JOINT REPLACEMENT    . REPLACEMENT TOTAL KNEE BILATERAL     No family history on file. Social History  Substance Use Topics  . Smoking status: Never Smoker  . Smokeless tobacco: Not on file  . Alcohol use No   OB History    No data available     Review of Systems  Constitutional: Positive for chills. Negative for fever.  HENT: Positive for congestion, rhinorrhea and sore throat (scratchy). Negative for ear pain, trouble swallowing and voice change.   Respiratory: Positive for cough. Negative for shortness of breath.   Cardiovascular: Negative for chest pain and palpitations.  Gastrointestinal: Negative for abdominal pain, diarrhea, nausea  and vomiting.  Genitourinary: Positive for decreased urine volume and dysuria. Negative for flank pain, frequency, pelvic pain and urgency.  Musculoskeletal: Negative for arthralgias, back pain and myalgias.  Skin: Negative for rash.    Allergies  Phentermine  Home Medications   Prior to Admission medications   Medication Sig Start Date End Date Taking? Authorizing Provider  allopurinol (ZYLOPRIM) 300 MG tablet Take 300 mg by mouth daily.   Yes Historical Provider, MD  amLODipine (NORVASC) 10 MG tablet Take 10 mg by mouth daily.   Yes Historical Provider, MD  cloNIDine (CATAPRES) 0.3 MG tablet Take 0.3 mg by mouth 2 (two) times daily.   Yes Historical Provider, MD  clopidogrel (PLAVIX) 75 MG tablet Take 75 mg by mouth daily.   Yes Historical Provider, MD  ergocalciferol (VITAMIN D2) 50000 UNITS capsule Take 50,000 Units by mouth once a week.   Yes Historical Provider, MD  fluticasone Aleda Grana(FLONASE) 50 MCG/ACT nasal spray 1 or 2 sprays each nostril twice a day 01/12/15  Yes Lajean Manesavid Massey, MD  furosemide (LASIX) 20 MG tablet Take 20 mg by mouth 2 (two) times daily.   Yes Historical Provider, MD  glimepiride (AMARYL) 4 MG tablet Take 4 mg by mouth daily with breakfast.   Yes Historical Provider, MD  hydrALAZINE (APRESOLINE) 25 MG tablet Take 25 mg by mouth 3 (three) times daily.   Yes Historical Provider,  MD  HYDROcodone-acetaminophen (NORCO) 7.5-325 MG tablet Take 1 tablet by mouth every 6 (six) hours as needed for moderate pain.   Yes Historical Provider, MD  isosorbide mononitrate (IMDUR) 30 MG 24 hr tablet Take 30 mg by mouth daily.   Yes Historical Provider, MD  LORazepam (ATIVAN) 0.5 MG tablet Take 0.5 mg by mouth every 8 (eight) hours.   Yes Historical Provider, MD  losartan (COZAAR) 50 MG tablet Take 50 mg by mouth daily.   Yes Historical Provider, MD  magnesium oxide (MAG-OX) 400 MG tablet Take 400 mg by mouth daily.   Yes Historical Provider, MD  nebivolol (BYSTOLIC) 5 MG tablet Take 5 mg  by mouth daily.   Yes Historical Provider, MD  pravastatin (PRAVACHOL) 20 MG tablet Take 20 mg by mouth daily.   Yes Historical Provider, MD  zolpidem (AMBIEN) 5 MG tablet Take 5 mg by mouth at bedtime as needed for sleep.   Yes Historical Provider, MD  benzonatate (TESSALON) 100 MG capsule Take 1-2 capsules (100-200 mg total) by mouth every 8 (eight) hours. 12/17/15   Junius FinnerErin O'Malley, PA-C  esomeprazole (NEXIUM) 40 MG capsule Take 40 mg by mouth daily at 12 noon.    Historical Provider, MD  promethazine-codeine (PHENERGAN WITH CODEINE) 6.25-10 MG/5ML syrup Take 1-2 teaspoons every 6 hours as needed for cough. Caution: May cause drowsiness. 01/12/15   Lajean Manesavid Massey, MD   Meds Ordered and Administered this Visit  Medications - No data to display  BP 134/70 (BP Location: Left Arm)   Pulse 79   Temp 98.2 F (36.8 C) (Oral)   Wt 287 lb (130.2 kg)   SpO2 95%   BMI 52.49 kg/m  No data found.   Physical Exam  Constitutional: She appears well-developed and well-nourished. No distress.  HENT:  Head: Normocephalic and atraumatic.  Right Ear: Tympanic membrane normal.  Left Ear: Tympanic membrane normal.  Nose: No mucosal edema. Right sinus exhibits no maxillary sinus tenderness and no frontal sinus tenderness. Left sinus exhibits no maxillary sinus tenderness and no frontal sinus tenderness.  Eyes: Conjunctivae are normal. No scleral icterus.  Neck: Normal range of motion. Neck supple.  Cardiovascular: Normal rate, regular rhythm and normal heart sounds.   Pulmonary/Chest: Effort normal and breath sounds normal. No respiratory distress. She has no wheezes. She has no rales.  Abdominal: Soft. She exhibits no distension. There is no tenderness. There is no CVA tenderness.  Musculoskeletal: Normal range of motion.  Neurological: She is alert.  Skin: Skin is warm and dry. She is not diaphoretic.  Nursing note and vitals reviewed.   Urgent Care Course   Clinical Course     Procedures  (including critical care time)  Labs Review Labs Reviewed  POCT URINALYSIS DIP (MANUAL ENTRY) - Abnormal; Notable for the following:       Result Value   Protein Ur, POC =30 (*)    All other components within normal limits    Imaging Review No results found.   MDM   1. Dysuria   2. Viral URI with cough    Pt c/o 2 days of URI symptoms as well as 1 day of dysuria with slight decreased in urine production.  She notes she has not been eating or drinking a lot due to not feeling well.  UA: unremarkable, no evidence of infection Dysuria could be due to mild dehydration.  No evidence of bacterial infection at this time. Encouraged symptomatic treatment.  Rx: Tessalon Pt has f/u with PCP  on 12/22/15. Encouraged to keep appointment for recheck of symptoms.     Junius Finner, PA-C 12/17/15 1338

## 2015-12-20 ENCOUNTER — Telehealth: Payer: Self-pay | Admitting: Emergency Medicine

## 2015-12-20 NOTE — Telephone Encounter (Signed)
No answer and no VM; note that patient has appt. With her pcp on 12/22/15.

## 2016-04-27 ENCOUNTER — Encounter: Payer: Self-pay | Admitting: *Deleted

## 2016-04-27 ENCOUNTER — Emergency Department (INDEPENDENT_AMBULATORY_CARE_PROVIDER_SITE_OTHER): Payer: Medicare Other

## 2016-04-27 ENCOUNTER — Emergency Department (INDEPENDENT_AMBULATORY_CARE_PROVIDER_SITE_OTHER)
Admission: EM | Admit: 2016-04-27 | Discharge: 2016-04-27 | Disposition: A | Discharge status: Home / Self Care | Payer: Medicare Other | Source: Home / Self Care | Attending: Family Medicine | Admitting: Family Medicine

## 2016-04-27 DIAGNOSIS — M5441 Lumbago with sciatica, right side: Secondary | ICD-10-CM

## 2016-04-27 DIAGNOSIS — M47892 Other spondylosis, cervical region: Secondary | ICD-10-CM | POA: Diagnosis not present

## 2016-04-27 DIAGNOSIS — M50121 Cervical disc disorder at C4-C5 level with radiculopathy: Secondary | ICD-10-CM | POA: Diagnosis not present

## 2016-04-27 DIAGNOSIS — M5412 Radiculopathy, cervical region: Secondary | ICD-10-CM

## 2016-04-27 DIAGNOSIS — G8929 Other chronic pain: Secondary | ICD-10-CM

## 2016-04-27 DIAGNOSIS — M67921 Unspecified disorder of synovium and tendon, right upper arm: Secondary | ICD-10-CM

## 2016-04-27 DIAGNOSIS — M7711 Lateral epicondylitis, right elbow: Secondary | ICD-10-CM

## 2016-04-27 DIAGNOSIS — M8588 Other specified disorders of bone density and structure, other site: Secondary | ICD-10-CM | POA: Diagnosis not present

## 2016-04-27 MED ORDER — HYDROCODONE-ACETAMINOPHEN 7.5-325 MG PO TABS: 1.0000 | ORAL_TABLET | Freq: Four times a day (QID) | ORAL | 0 refills | Status: DC | PRN

## 2016-04-27 NOTE — ED Provider Notes (Signed)
[facility]    CSN: [id number] Arrival date & time: 04/27/16  1211     History   Chief Complaint Chief Complaint  Patient presents with  . Back Pain    HPI Tonya Kaufman is a 76 y.o. female.   Patient complains of 3 week history of persistent pain in her right neck, shoulder, and scapular area, added to her chronic right lower back sciatic pain.  The pain in her right shoulder radiates into her right elbow, and sometimes her right hand.  She recalls no recent injury.  She has been taking Norco for pain.   She denies bowel or bladder dysfunction, and no saddle numbness.  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      Past Medical History:  Diagnosis Date  . Anemia   . Arthritis   . CHF (congestive heart failure) (HCC)   . Chronic kidney disease (CKD), stage III (moderate)   . Degenerative joint disease   . Depression   . Diabetes mellitus without complication (HCC)   . Esophageal reflux   . Gout   . HTN (hypertension)   . Hyperlipidemia   . Migraine   . Obesity   . Obstructive sleep apnea   . Renal disorder   . Sleep apnea   . TIA (transient ischemic attack)     There are no active problems to display for this patient.   Past Surgical History:  Procedure Laterality Date  . ABDOMINAL HYSTERECTOMY    . CHOLECYSTECTOMY    . JOINT REPLACEMENT    . REPLACEMENT TOTAL KNEE BILATERAL      OB History    No data available       Home Medications    Prior to Admission medications   Medication Sig Start Date End Date Taking? Authorizing Provider  allopurinol (ZYLOPRIM) 300 MG tablet Take 300 mg by mouth daily.   Yes Historical Provider, MD  amLODipine (NORVASC) 10 MG tablet Take 10 mg by mouth daily.   Yes Historical Provider, MD  cloNIDine (CATAPRES) 0.3 MG tablet Take 0.3 mg by mouth 2 (two) times daily.   Yes Historical Provider, MD  clopidogrel (PLAVIX) 75 MG tablet Take 75 mg by mouth daily.   Yes Historical Provider, MD    ergocalciferol (VITAMIN D2) 50000 UNITS capsule Take 50,000 Units by mouth once a week.   Yes Historical Provider, MD  esomeprazole (NEXIUM) 40 MG capsule Take 40 mg by mouth daily at 12 noon.   Yes Historical Provider, MD  fluticasone Aleda Grana) 50 MCG/ACT nasal spray 1 or 2 sprays each nostril twice a day 01/12/15  Yes Lajean Manes, MD  furosemide (LASIX) 20 MG tablet Take 20 mg by mouth 2 (two) times daily.   Yes Historical Provider, MD  glimepiride (AMARYL) 4 MG tablet Take 4 mg by mouth daily with breakfast.   Yes Historical Provider, MD  hydrALAZINE (APRESOLINE) 25 MG tablet Take 25 mg by mouth 3 (three) times daily.   Yes Historical Provider, MD  LORazepam (ATIVAN) 0.5 MG tablet Take 0.5 mg by mouth every 8 (eight) hours.   Yes Historical Provider, MD  losartan (COZAAR) 50 MG tablet Take 50 mg by mouth daily.   Yes Historical Provider, MD  nebivolol (BYSTOLIC) 5 MG tablet Take 5 mg by mouth daily.   Yes Historical Provider, MD  pravastatin (PRAVACHOL) 20 MG tablet Take 20 mg by mouth daily.   Yes Historical Provider, MD  zolpidem (AMBIEN) 5 MG tablet Take 5  mg by mouth at bedtime as needed for sleep.   Yes Historical Provider, MD  HYDROcodone-acetaminophen (NORCO) 7.5-325 MG tablet Take 1 tablet by mouth every 6 (six) hours as needed for moderate pain. 04/27/16   [name], MD  magnesium oxide (MAG-OX) 400 MG tablet Take 400 mg by mouth daily.    Historical Provider, MD    Family History History reviewed. No pertinent family history.  Social History Social History  Substance Use Topics  . Smoking status: Never Smoker  . Smokeless tobacco: Never Used  . Alcohol use No     Allergies   Phentermine   Review of Systems Review of Systems  Constitutional: Positive for activity change and fatigue. Negative for appetite change, chills, diaphoresis and fever.  HENT: Negative.   Eyes: Negative.   Respiratory: Negative.   Cardiovascular: Negative.   Gastrointestinal: Negative.    Genitourinary: Negative.   Musculoskeletal: Positive for arthralgias, back pain, gait problem, neck pain and neck stiffness. Negative for joint swelling and myalgias.  Skin: Negative.   Neurological: Positive for numbness. Negative for dizziness.     Physical Exam Triage Vital Signs ED Triage Vitals  Enc Vitals Group     BP 04/27/16 1213 135/77     Pulse Rate 04/27/16 1213 70     Resp 04/27/16 1213 16     Temp 04/27/16 1213 97.9 F (36.6 C)     Temp Source 04/27/16 1213 Oral     SpO2 04/27/16 1213 94 %     Weight 04/27/16 1213 278 lb (126.1 kg)     Height 04/27/16 1213  (1.575 m)     Head Circumference --      Peak Flow --      Pain Score 04/27/16 1214 8     Pain Loc --      Pain Edu? --      Excl. in GC? --    No data found.   Updated Vital Signs BP 135/77 (BP Location: Left Arm)   Pulse 70   Temp 97.9 F (36.6 C) (Oral)   Resp 16   Ht  (1.575 m)   Wt 278 lb (126.1 kg)   SpO2 94%   BMI 50.85 kg/m   Visual Acuity Right Eye Distance:   Left Eye Distance:   Bilateral Distance:    Right Eye Near:   Left Eye Near:    Bilateral Near:     Physical Exam  Constitutional: She appears well-developed and well-nourished. No distress.  HENT:  Head: Normocephalic.  Right Ear: External ear normal.  Left Ear: External ear normal.  Nose: Nose normal.  Mouth/Throat: Oropharynx is clear and moist.  Eyes: Conjunctivae and EOM are normal. Pupils are equal, round, and reactive to light.  Neck: Neck supple.    Cardiovascular: Normal rate and normal heart sounds.   Pulmonary/Chest: Breath sounds normal.  Abdominal: Soft. There is tenderness.  Musculoskeletal: She exhibits no edema.       Cervical back: She exhibits decreased range of motion and tenderness.       Back:       Arms: There is distinct tenderness over medial and inferior edges of right scapula.  Pain elicited by resisted abduction of right shoulder while palpating right rhomboid muscles.  Back:    Decreased range of motion.  There is right side paraspinous muscle tenderness from beneath the right scapula to her right sacrum.  Lower back tenderness is maximal over the right SI joint. Straight  leg raising test is positive on the right.  Sitting knee extension test is negative.  Patellar and achilles reflexes are present.  Right shoulder has relatively good range of motion.  She has mild difficulty with Apley's test.  Empty can negative.  Good internal/external strength and range of motion.  There is distinct tenderness to palpation over the insertion of the long head of the right biceps tendon.   There is distinct tenderness over the right lateral epicondyle.  Palpation there during resisted dorsiflexion and supination of the wrist recreates her elbow pain.    Lymphadenopathy:    She has no cervical adenopathy.  Neurological: She is alert.  Skin: Skin is warm and dry.  Nursing note and vitals reviewed.    UC Treatments / Results  Labs (all labs ordered are listed, but only abnormal results are displayed) Labs Reviewed - No data to display  EKG  EKG Interpretation None       Radiology Dg Cervical Spine Complete  Result Date: 04/27/2016 CLINICAL DATA:  76 year old female with history of right-sided neck and low back pain. Right-sided radiculopathy for the past 3 weeks. No history of injury. EXAM: CERVICAL SPINE - COMPLETE 4+ VIEW COMPARISON:  No priors. FINDINGS: No acute displaced cervical spine fractures. Straightening of normal cervical lordosis, presumably chronic related to underlying spondylosis. Prevertebral soft tissues are normal. Multilevel degenerative disc disease, most severe at C4-C5 and C5-C6. Multilevel facet arthropathy. IMPRESSION: 1. No acute radiographic abnormality of the cervical spine. 2. Multilevel degenerative disc disease and cervical spondylosis, as above. Electronically Signed   By: Trudie Reed M.D.   On: 04/27/2016 13:45   Dg Lumbar Spine  Complete  Result Date: 04/27/2016 CLINICAL DATA:  Low back pain EXAM: LUMBAR SPINE - COMPLETE 4+ VIEW COMPARISON:  None. FINDINGS: No evidence of fracture. No subluxation. Loss of disc height seen at L3-4 and L5-S1. Bones are diffusely demineralized. SI joints unremarkable. IMPRESSION: Osteopenia with degenerative changes.  No acute bony abnormality. Electronically Signed   By: Kennith Center M.D.   On: 04/27/2016 13:15    Review of MR LS spine [facility]) done 07/30/15 reveals multi-level disease. Note that at "L5-S1: Anterolisthesis L5 on S1 with moderate diffuse disc bulge. Moderate to severe bilateral facet joint arthropathy contributes to moderate to severe bilateral neural foraminal narrowing."  Procedures Procedures (including critical care time)  Medications Ordered in UC Medications - No data to display   Initial Impression / Assessment and Plan / UC Course  I have reviewed the triage vital signs and the nursing notes.  Pertinent labs & imaging results that were available during my care of the patient were reviewed by me and considered in my medical decision making (see chart for details).    Patient's musculoskeletal pain is multi-factorial.  Her right neck, shoulder, and upper back pain is complicated by an overlay of right lateral epicondylitis and probably right biceps tendonitis.  She has distinct tenderness to palpation over her right SI joint.  She has multiple co-morbidities including gout and poorly controlled diabetes.  Rx written for Norco.  Apply ice pack for 20 to 30 minutes, 3 to 4 times daily  Continue until pain decreases. Begin exercises for right biceps tendonitis and right lateral epicondylitis.  She states that she has a soft cervical collar at home that she wears at night; suggested that she wear daytime also. Will refer to Dr. Clementeen Graham for further evaluation and management.    Final Clinical Impressions(s) /  UC Diagnoses   Final  diagnoses:  Cervical radiculopathy, chronic  Chronic right-sided low back pain with right-sided sciatica  Right lateral epicondylitis  Tendinopathy of right biceps tendon    New Prescriptions Current Discharge Medication List       [name], MD 04/27/16 1455

## 2016-04-27 NOTE — Discharge Instructions (Signed)
Apply ice pack for 20 to 30 minutes, 3 to 4 times daily  Continue until pain decreases.  °

## 2016-04-27 NOTE — ED Triage Notes (Signed)
Pt c/o burning RT side shoulder and back pain x 4 wks. hx of arthritis in RT shoulder. She has taken hydrocodone and tylenol arthritis with minimal relief.

## 2016-04-28 ENCOUNTER — Encounter: Payer: Self-pay | Admitting: Family Medicine

## 2016-04-28 ENCOUNTER — Ambulatory Visit (INDEPENDENT_AMBULATORY_CARE_PROVIDER_SITE_OTHER): Payer: Medicare Other | Admitting: Family Medicine

## 2016-04-28 DIAGNOSIS — M25511 Pain in right shoulder: Secondary | ICD-10-CM

## 2016-04-28 DIAGNOSIS — M542 Cervicalgia: Secondary | ICD-10-CM | POA: Diagnosis not present

## 2016-04-28 MED ORDER — DICLOFENAC SODIUM 1 % TD GEL: 2.0000 g | Freq: Four times a day (QID) | TRANSDERMAL | 11 refills | Status: DC

## 2016-04-28 NOTE — Patient Instructions (Addendum)
Thank you for coming in today. Get a xray of the shoulder in the near future before you return.   Start using voltaren gel 4x daily on the neck and shoulder.   Attend PT.   Recheck with me in 2-4 weeks.   You can get voltaren gel at [facility] with no insurance  With the coupon.   You could also use aspercream.

## 2016-04-28 NOTE — Progress Notes (Signed)
Subjective:    I'm seeing this patient as a consultation for:  Tonya Haw, MD.  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  The pain seems to have started in the neck and radiated to her back, shoulder, and down her arm.  The back and shoulder pain is constant and dull.  The pain down her arm is burning and radiates down the lateral aspect of arm, forearm and dorsal hand.  This pain feels very similar to her sciatica in her back/leg.   She denies recent trauma, swelling, fever, SOB.  She was seen in urgent care yesterday for the same complaint where X-rays of her lumbar and cervical spine were preformed.  Her lumbar spine had evidence of osteopenia and degenerative changes.  Her cervical spine film had evidence of multilevel degenerative disc disease and cervical spondylosis.  She did not have imaging of her shoulder at this time.   Her worsening pain has led her to use more Norco than what is usual for her.  This concerns her and she would like to get the pain under control.      Past medical history, Surgical history, Family history not pertinant except as noted below, Social history, Allergies, and medications have been entered into the medical record, reviewed, and no changes needed.   Review of Systems: No headache, visual changes, nausea, vomiting, diarrhea, constipation, dizziness, abdominal pain, skin rash, fevers, chills, night sweats, weight loss, swollen lymph nodes, body aches, joint swelling, chest pain, shortness of breath, mood changes, visual or auditory hallucinations.   Objective:    Vitals:   04/28/16 1037  BP: (!) 151/60  Pulse: 72   General: Well Developed, well nourished, and in no acute distress.  Neuro/Psych: Alert and oriented x3, extra-ocular muscles intact, able  to move all 4 extremities, sensation grossly intact. Skin: Warm and dry, no rashes noted.  Respiratory: Not using accessory muscles, speaking in full sentences, trachea midline.  Cardiovascular: Pulses palpable, no extremity edema. Abdomen: Does not appear distended. MSK:  Cspine: Diffusely tender but not particularly tender along the spinal midline. Decreased cervical motion due to pain. Positive right-sided Spurling's test. Right shoulder: Normal-appearing nontender. Range of motion Abduction 100 External rotation full Internal rotation to the lumbar spine Right elbow mildly tender to palpation at the lateral epicondyle. Mild pain with resisted wrist extension. Upper extremity strength is otherwise normal. Pulses capillary refill and sensation are intact upper extremity. L-spine nontender midline. Tender to palpation right lumbar paraspinal muscle group. Antalgic gait present.   No results found for this or any previous visit (from the past 24 hour(s)). Dg Cervical Spine Complete  Result Date: 04/27/2016 CLINICAL DATA:  76 year old female with history of right-sided neck and low back pain. Right-sided radiculopathy for the past 3 weeks. No history of injury. EXAM: CERVICAL SPINE - COMPLETE 4+ VIEW COMPARISON:  No priors. FINDINGS: No acute displaced cervical spine fractures. Straightening of normal cervical lordosis, presumably chronic related to underlying spondylosis. Prevertebral soft tissues are normal. Multilevel degenerative disc disease, most severe at C4-C5 and C5-C6. Multilevel facet arthropathy. IMPRESSION: 1. No acute radiographic abnormality of the cervical spine. 2. Multilevel degenerative disc disease and cervical spondylosis, as above. Electronically Signed   By: Trudie Reed M.D.   On: 04/27/2016 13:45  Dg Lumbar Spine Complete  Result Date: 04/27/2016 CLINICAL DATA:  Low back pain EXAM: LUMBAR SPINE - COMPLETE 4+ VIEW COMPARISON:  None. FINDINGS: No evidence of  fracture. No subluxation. Loss of disc height seen at L3-4 and L5-S1. Bones are diffusely demineralized. SI joints unremarkable. IMPRESSION: Osteopenia with degenerative changes.  No acute bony abnormality. Electronically Signed   By: Kennith Center M.D.   On: 04/27/2016 13:15    Impression and Recommendations:    Assessment and Plan: 76 y.o. female with right sided shoulder, neck and back pain.  This is likely to be a cervical radiculopathy given the degeneration in her cervical spine on imaging, complicated by rotator cuff tendonitis.  Plain film of right shoulder to evaluate for pathology of the joint. Refer to PT and voltaren gel for pain. Follow-up in 2-4 weeks    Discussed warning signs or symptoms. Please see discharge instructions. Patient expresses understanding.

## 2016-04-29 ENCOUNTER — Ambulatory Visit (INDEPENDENT_AMBULATORY_CARE_PROVIDER_SITE_OTHER): Payer: Medicare Other

## 2016-04-29 DIAGNOSIS — M542 Cervicalgia: Secondary | ICD-10-CM

## 2016-04-29 DIAGNOSIS — M75101 Unspecified rotator cuff tear or rupture of right shoulder, not specified as traumatic: Secondary | ICD-10-CM

## 2016-04-29 DIAGNOSIS — M25511 Pain in right shoulder: Secondary | ICD-10-CM

## 2016-05-04 ENCOUNTER — Encounter: Payer: Medicare Other | Admitting: Physical Therapy

## 2016-05-06 ENCOUNTER — Telehealth: Payer: Self-pay | Admitting: Family Medicine

## 2016-05-06 NOTE — Telephone Encounter (Signed)
PA denied. We no longer need to send PA for Diclofenac. Discount coupon cards can be given to pt to help with costs.

## 2016-05-06 NOTE — Telephone Encounter (Signed)
Sent prior authorization through cover my meds waiting on determination. - CF °

## 2016-05-19 ENCOUNTER — Ambulatory Visit (INDEPENDENT_AMBULATORY_CARE_PROVIDER_SITE_OTHER): Payer: Medicare Other | Admitting: Family Medicine

## 2016-05-19 ENCOUNTER — Encounter: Payer: Self-pay | Admitting: Family Medicine

## 2016-05-19 VITALS — BP 167/84 | HR 67 | Ht 62.0 in | Wt 280.0 lb

## 2016-05-19 DIAGNOSIS — M25511 Pain in right shoulder: Secondary | ICD-10-CM | POA: Diagnosis not present

## 2016-05-19 DIAGNOSIS — M542 Cervicalgia: Secondary | ICD-10-CM

## 2016-05-19 MED ORDER — HYDROCODONE-ACETAMINOPHEN 7.5-325 MG PO TABS: 1.0000 | ORAL_TABLET | Freq: Four times a day (QID) | ORAL | 0 refills | Status: AC | PRN

## 2016-05-19 NOTE — Patient Instructions (Signed)
Thank you for coming in today. Use norco sparingly.  Continue the home exercises.  Return as needed.    Secondary Shoulder Impingement Syndrome Rehab Ask your health care provider which exercises are safe for you. Do exercises exactly as told by your health care provider and adjust them as directed. It is normal to feel mild stretching, pulling, tightness, or discomfort as you do these exercises, but you should stop right away if you feel sudden pain or your pain gets worse. Do not begin these exercises until told by your health care provider. Stretching and range of motion exercise This exercise warms up your muscles and joints and improves the movement and flexibility of your neck and shoulder. This exercise also helps to relieve pain and stiffness. Exercise A: Cervical side bend   1. Using good posture, sit on a stable chair, or stand up. 2. Without moving your shoulders, slowly tilt your left / right ear toward your left / right shoulder until you feel a stretch in your neck muscles. You should be looking straight ahead. 3. Hold for __________ seconds. 4. Slowly return to the starting position. 5. Repeat on your left / right side. Repeat __________ times. Complete this exercise __________ times a day. Strengthening exercises These exercises build strength and endurance in your shoulder. Endurance is the ability to use your muscles for a long time, even after they get tired. Exercise B: Scapular protraction, supine   1. Lie on your back on a firm surface. Hold a __________ weight in your left / right hand. 2. Raise your left / right arm straight into the air so your hand is directly above your shoulder joint. 3. Push the weight into the air so your shoulder lifts off of the surface that you are lying on. Do not move your head, neck, or back. 4. Hold for __________ seconds. 5. Slowly return to the starting position. Let your muscles relax completely before you repeat this  exercise. Repeat __________ times. Complete this exercise __________ times a day. Exercise C: Scapular retraction   1. Sit in a stable chair without armrests, or stand. 2. Secure an exercise band to a stable object in front of you so the band is at shoulder height. 3. Hold one end of the exercise band in each hand. Your palms should face down. 4. Squeeze your shoulder blades together and move your elbows slightly behind you. Do not shrug your shoulders while you do this. 5. Hold for __________ seconds. 6. Slowly return to the starting position. Repeat __________ times. Complete this exercise __________ times a day. Exercise D: Shoulder extension with scapular retraction   1. Sit in a stable chair without armrests, or stand. 2. Secure an exercise band to a stable object in front of you where it is above shoulder height. 3. Hold one end of the exercise band in each hand. 4. Straighten your elbows and lift your hands up to shoulder height. 5. Squeeze your shoulder blades together and pull your hands down to the sides of your thighs. Stop when your hands are straight down by your sides. Do not let your hands go behind your body. 6. Hold for __________ seconds. 7. Slowly return to the starting position. Repeat __________ times. Complete this exercise __________ times a day. Exercise E: Shoulder abduction  1. Sit in a stable chair without armrests, or stand. 2. If directed, hold a __________ weight in your left / right hand. 3. Start with your arms straight down. Turn your left /  right hand so your palm faces in, toward your body. 4. Slowly lift your left / right hand out to your side. Do not lift your hand above shoulder height.  Keep your arms straight.  Avoid shrugging your shoulder while you do this movement. Keep your shoulder blade tucked down toward the middle of your back. 5. Hold for __________ seconds. 6. Slowly lower your arm, and return to the starting position. Repeat  __________ times. Complete this exercise __________ times a day. This information is not intended to replace advice given to you by your health care provider. Make sure you discuss any questions you have with your health care provider. Document Released: 12/27/2004 Document Revised: 09/03/2015 Document Reviewed: 11/29/2014 Elsevier Interactive Patient Education  2017 [employer].

## 2016-05-19 NOTE — Progress Notes (Signed)
Tonya Kaufman is a 76 y.o. female who presents to [facility] today for follow-up shoulder pain. Patient was seen on April 19 for shoulder pain and probable cervical radicular symptoms. She's been doing home exercise program and notes considerable improvement in symptoms. She occasionally has some bothersome symptoms. She would like a short-term refill of the Norco that was provided in urgent care. She has chronic pain medications provided by her primary care provider as well although these has run out because she was using them more than usual because her pain was worse recently. She has an appointment with her primary care provider soon.   Past Medical History:  Diagnosis Date  . Anemia   . Arthritis   . CHF (congestive heart failure) (HCC)   . Chronic kidney disease (CKD), stage III (moderate)   . Degenerative joint disease   . Depression   . Diabetes mellitus without complication (HCC)   . Esophageal reflux   . Gout   . HTN (hypertension)   . Hyperlipidemia   . Migraine   . Obesity   . Obstructive sleep apnea   . Renal disorder   . Sleep apnea   . TIA (transient ischemic attack)    Past Surgical History:  Procedure Laterality Date  . ABDOMINAL HYSTERECTOMY    . CHOLECYSTECTOMY    . JOINT REPLACEMENT    . REPLACEMENT TOTAL KNEE BILATERAL     Social History  Substance Use Topics  . Smoking status: Never Smoker  . Smokeless tobacco: Never Used  . Alcohol use No     ROS:  As above   Medications: Current Outpatient Prescriptions  Medication Sig Dispense Refill  . allopurinol (ZYLOPRIM) 300 MG tablet Take 300 mg by mouth daily.    Marland Kitchen amLODipine (NORVASC) 10 MG tablet Take 10 mg by mouth daily.    . cloNIDine (CATAPRES) 0.3 MG tablet Take 0.3 mg by mouth 2 (two) times daily.    . clopidogrel (PLAVIX) 75 MG tablet Take 75 mg by mouth daily.    . diclofenac sodium (VOLTAREN) 1 % GEL Apply 2 g topically 4 (four) times daily.  To affected joint. 100 g 11  . fluticasone (FLONASE) 50 MCG/ACT nasal spray 1 or 2 sprays each nostril twice a day 16 g 0  . furosemide (LASIX) 20 MG tablet Take 20 mg by mouth 2 (two) times daily.    Marland Kitchen glimepiride (AMARYL) 4 MG tablet Take 4 mg by mouth daily with breakfast.    . hydrALAZINE (APRESOLINE) 25 MG tablet Take 25 mg by mouth 3 (three) times daily.    Marland Kitchen HYDROcodone-acetaminophen (NORCO) 7.5-325 MG tablet Take 1 tablet by mouth every 6 (six) hours as needed for moderate pain. 10 tablet 0  . LORazepam (ATIVAN) 0.5 MG tablet Take 0.5 mg by mouth every 8 (eight) hours.    Marland Kitchen losartan (COZAAR) 50 MG tablet Take 50 mg by mouth daily.    . magnesium oxide (MAG-OX) 400 MG tablet Take 400 mg by mouth daily.    . nebivolol (BYSTOLIC) 5 MG tablet Take 5 mg by mouth daily.    . pravastatin (PRAVACHOL) 20 MG tablet Take 20 mg by mouth daily.    Marland Kitchen zolpidem (AMBIEN) 5 MG tablet Take 5 mg by mouth at bedtime as needed for sleep.     No current facility-administered medications for this visit.    Allergies  Allergen Reactions  . Promethazine Other (See Comments)  . Phentermine  Exam:  BP (!) 167/84   Pulse 67   Ht 5\' 2"  (1.575 m)   Wt 280 lb (127 kg)   BMI 51.21 kg/m  General: Well Developed, well nourished, and in no acute distress.  Neuro/Psych: Alert and oriented x3, extra-ocular muscles intact, able to move all 4 extremities, sensation grossly intact. Skin: Warm and dry, no rashes noted.  Respiratory: Not using accessory muscles, speaking in full sentences, trachea midline.  Cardiovascular: Pulses palpable, no extremity edema. Abdomen: Does not appear distended. MSK: Right shoulder normal appearing nontender normal motion intact strength    No results found for this or any previous visit (from the past 48 hour(s)). No results found.    Assessment and Plan: 76 y.o. female with right shoulder pain improving. Continue home exercise program. Very limited refill of Norco. 10  tabs provided. Recommend follow-up with PCP. [place] was [place] controlled substances reporting system    No orders of the defined types were placed in this encounter.   Discussed warning signs or symptoms. Please see discharge instructions. Patient expresses understanding.

## 2017-01-05 ENCOUNTER — Emergency Department (INDEPENDENT_AMBULATORY_CARE_PROVIDER_SITE_OTHER)
Admission: EM | Admit: 2017-01-05 | Discharge: 2017-01-05 | Disposition: A | Discharge status: Home / Self Care | Payer: Medicare Other | Source: Home / Self Care | Attending: Family Medicine | Admitting: Family Medicine

## 2017-01-05 ENCOUNTER — Encounter: Payer: Self-pay | Admitting: Emergency Medicine

## 2017-01-05 ENCOUNTER — Other Ambulatory Visit: Payer: Self-pay

## 2017-01-05 DIAGNOSIS — M79604 Pain in right leg: Secondary | ICD-10-CM

## 2017-01-05 NOTE — ED Provider Notes (Signed)
[facility]    CSN: [id number] Arrival date & time: 01/05/17  1007     History   Chief Complaint Chief Complaint  Patient presents with  . Leg Pain    HPI Tonya Kaufman is a 76 y.o. female.   Patient complains of persistent painful swelling in her right lateral calf and lower leg for about 2 weeks.  The swelling decreases somewhat each morning but is generally getting worse.  She recalls no recent injury.  She is concerned about possible DVT.  She denies chest pain or shortness of breath. She has a past history of right knee replacement, and a traumatic fracture of her right lower leg.   The history is provided by the patient.    Past Medical History:  Diagnosis Date  . Anemia   . Arthritis   . CHF (congestive heart failure) (HCC)   . Chronic kidney disease (CKD), stage III (moderate) (HCC)   . Degenerative joint disease   . Depression   . Diabetes mellitus without complication (HCC)   . Esophageal reflux   . Gout   . HTN (hypertension)   . Hyperlipidemia   . Migraine   . Obesity   . Obstructive sleep apnea   . Renal disorder   . Sleep apnea   . TIA (transient ischemic attack)     Patient Active Problem List   Diagnosis Date Noted  . Right shoulder pain 04/28/2016  . Cervical spine pain 04/28/2016    Past Surgical History:  Procedure Laterality Date  . ABDOMINAL HYSTERECTOMY    . CHOLECYSTECTOMY    . JOINT REPLACEMENT    . REPLACEMENT TOTAL KNEE BILATERAL      OB History    No data available       Home Medications    Prior to Admission medications   Medication Sig Start Date End Date Taking? Authorizing Provider  allopurinol (ZYLOPRIM) 300 MG tablet Take 300 mg by mouth daily.    [provider]  amLODipine (NORVASC) 10 MG tablet Take 10 mg by mouth daily.    [provider]  cloNIDine (CATAPRES) 0.3 MG tablet Take 0.3 mg by mouth 2 (two) times daily.    [provider]  clopidogrel (PLAVIX) 75 MG tablet  Take 75 mg by mouth daily.    [provider]  diclofenac sodium (VOLTAREN) 1 % GEL Apply 2 g topically 4 (four) times daily. To affected joint. 04/28/16   Rodolph Bong, MD  fluticasone Aleda Grana) 50 MCG/ACT nasal spray 1 or 2 sprays each nostril twice a day 01/12/15   Lajean Manes, MD  furosemide (LASIX) 20 MG tablet Take 20 mg by mouth 2 (two) times daily.    [provider]  glimepiride (AMARYL) 4 MG tablet Take 4 mg by mouth daily with breakfast.    [provider]  hydrALAZINE (APRESOLINE) 25 MG tablet Take 25 mg by mouth 3 (three) times daily.    [provider]  HYDROcodone-acetaminophen (NORCO) 7.5-325 MG tablet Take 1 tablet by mouth every 6 (six) hours as needed for moderate pain. 05/19/16   Rodolph Bong, MD  LORazepam (ATIVAN) 0.5 MG tablet Take 0.5 mg by mouth every 8 (eight) hours.    [provider]  losartan (COZAAR) 50 MG tablet Take 50 mg by mouth daily.    [provider]  magnesium oxide (MAG-OX) 400 MG tablet Take 400 mg by mouth daily.    [provider]  nebivolol (BYSTOLIC) 5 MG  tablet Take 5 mg by mouth daily.    [provider]  pravastatin (PRAVACHOL) 20 MG tablet Take 20 mg by mouth daily.    [provider]  zolpidem (AMBIEN) 5 MG tablet Take 5 mg by mouth at bedtime as needed for sleep.    [provider]    Family History No family history on file.  Social History Social History   Tobacco Use  . Smoking status: Never Smoker  . Smokeless tobacco: Never Used  Substance Use Topics  . Alcohol use: No  . Drug use: No     Allergies   Promethazine and Phentermine   Review of Systems Review of Systems  Constitutional: Negative for chills, diaphoresis, fatigue and fever.  HENT: Negative.   Eyes: Negative.   Respiratory: Negative for cough, chest tightness and shortness of breath.   Cardiovascular: Positive for leg swelling. Negative for chest pain.  Gastrointestinal:  Negative.   Genitourinary: Negative.   Musculoskeletal:       Right lower leg pain and swelling  Skin: Positive for color change.  Neurological: Negative for headaches.     Physical Exam Triage Vital Signs ED Triage Vitals  Enc Vitals Group     BP 01/05/17 1100 (!) 174/82     Pulse Rate 01/05/17 1100 69     Resp --      Temp 01/05/17 1100 98.5 F (36.9 C)     Temp Source 01/05/17 1100 Oral     SpO2 01/05/17 1100 94 %     Weight 01/05/17 1101 294 lb (133.4 kg)     Height 01/05/17 1101 5\' 2"  (1.575 m)     Head Circumference --      Peak Flow --      Pain Score 01/05/17 1101 7     Pain Loc --      Pain Edu? --      Excl. in GC? --    No data found.  Updated Vital Signs BP (!) 174/82 (BP Location: Right Arm)   Pulse 69   Temp 98.5 F (36.9 C) (Oral)   Ht 5\' 2"  (1.575 m)   Wt 294 lb (133.4 kg)   SpO2 94%   BMI 53.77 kg/m   Visual Acuity Right Eye Distance:   Left Eye Distance:   Bilateral Distance:    Right Eye Near:   Left Eye Near:    Bilateral Near:     Physical Exam  Constitutional: She appears well-developed and well-nourished. No distress.  HENT:  Head: Normocephalic.  Eyes: Pupils are equal, round, and reactive to light.  Neck: Neck supple.  Cardiovascular: Normal heart sounds.  Pulmonary/Chest: Breath sounds normal.  Musculoskeletal:       Right lower leg: She exhibits tenderness, swelling and edema. She exhibits no bony tenderness.       Legs: Right lateral calf is swollen, slightly warm, and tender to palpation.  2+ edema present.  Neurological: She is alert.  Skin: Skin is warm and dry.  Nursing note and vitals reviewed.    UC Treatments / Results  Labs (all labs ordered are listed, but only abnormal results are displayed) Labs Reviewed - No data to display  EKG  EKG Interpretation None       Radiology No results found.  Procedures Procedures (including critical care time)  Medications Ordered in UC Medications - No data to  display   Initial Impression / Assessment and Plan / UC Course  I have reviewed the triage  vital signs and the nursing notes.  Pertinent labs & imaging results that were available during my care of the patient were reviewed by me and considered in my medical decision making (see chart for details).    Concern for DVT. Patient's insurance will not cover venous ultrasound in our facility.  Advised patient to proceed immediately [facility]. Patient safe to travel by private vehicle (son to drive).    Final Clinical Impressions(s) / UC Diagnoses   Final diagnoses:  Right leg pain    ED Discharge Orders    None           Lattie HawBeese, Dequarius Jeffries A, MD 01/07/17 2117

## 2017-01-05 NOTE — ED Triage Notes (Signed)
Right leg pain and swelling x 2 weeks

## 2017-01-05 NOTE — Discharge Instructions (Signed)
Proceed to Hospital Emergency Department for further evaluation

## 2017-01-06 ENCOUNTER — Telehealth: Payer: Self-pay

## 2017-01-06 NOTE — Telephone Encounter (Signed)
Went to [facility], no DVT seen.  Pt very appreciative of the care she received.

## 2017-02-08 DIAGNOSIS — I83891 Varicose veins of right lower extremities with other complications: Secondary | ICD-10-CM | POA: Insufficient documentation

## 2017-02-12 ENCOUNTER — Encounter: Payer: Self-pay | Admitting: Emergency Medicine

## 2017-02-12 ENCOUNTER — Other Ambulatory Visit: Payer: Self-pay

## 2017-02-12 ENCOUNTER — Emergency Department (INDEPENDENT_AMBULATORY_CARE_PROVIDER_SITE_OTHER)
Admission: EM | Admit: 2017-02-12 | Discharge: 2017-02-12 | Disposition: A | Discharge status: Other Acute Inpatient Hospital | Payer: Medicare Other | Source: Home / Self Care | Attending: Family Medicine | Admitting: Family Medicine

## 2017-02-12 DIAGNOSIS — I259 Chronic ischemic heart disease, unspecified: Secondary | ICD-10-CM

## 2017-02-12 DIAGNOSIS — R9439 Abnormal result of other cardiovascular function study: Secondary | ICD-10-CM | POA: Insufficient documentation

## 2017-02-12 NOTE — ED Notes (Signed)
Upon obtaining EKG, evaluation by Dr.Beese; asa 324mg  and NG 0.4 sl; O2 started via nasal canula at 2L; 911 called.

## 2017-02-12 NOTE — ED Triage Notes (Signed)
Patient reports chest pain and pain in left arm since yesterday that has worsened; denies SOB; gives history of heart disease.

## 2017-02-14 MED ORDER — AMLODIPINE BESYLATE 5 MG PO TABS
5.00 | ORAL_TABLET | ORAL | Status: DC
Start: ? — End: 2017-02-14

## 2017-02-14 MED ORDER — INSULIN LISPRO 100 UNIT/ML ~~LOC~~ SOLN
SUBCUTANEOUS | Status: DC
Start: ? — End: 2017-02-14

## 2017-02-14 MED ORDER — HEPARIN SODIUM (PORCINE) 1000 UNIT/ML IJ SOLN
INTRAMUSCULAR | Status: DC
Start: ? — End: 2017-02-14

## 2017-02-14 MED ORDER — INSULIN LISPRO 100 UNIT/ML ~~LOC~~ SOLN
SUBCUTANEOUS | Status: DC
Start: 2017-02-13 — End: 2017-02-14

## 2017-02-14 MED ORDER — SENNA-DOCUSATE SODIUM 8.6-50 MG PO TABS
ORAL_TABLET | ORAL | Status: DC
Start: ? — End: 2017-02-14

## 2017-02-14 MED ORDER — GUAIFENESIN 100 MG/5ML PO LIQD
200.00 | ORAL | Status: DC
Start: ? — End: 2017-02-14

## 2017-02-14 MED ORDER — INSULIN GLARGINE 100 UNIT/ML ~~LOC~~ SOLN
SUBCUTANEOUS | Status: DC
Start: ? — End: 2017-02-14

## 2017-02-14 MED ORDER — LIDOCAINE HCL 2 % IJ SOLN
INTRAMUSCULAR | Status: DC
Start: ? — End: 2017-02-14

## 2017-02-14 MED ORDER — LOSARTAN POTASSIUM 25 MG PO TABS
25.00 | ORAL_TABLET | ORAL | Status: DC
Start: 2017-02-14 — End: 2017-02-14

## 2017-02-14 MED ORDER — ALPRAZOLAM 0.25 MG PO TABS
.25 | ORAL_TABLET | ORAL | Status: DC
Start: ? — End: 2017-02-14

## 2017-02-14 MED ORDER — IOPAMIDOL (ISOVUE-370) INJECTION 76%
INTRAVENOUS | Status: DC
Start: ? — End: 2017-02-14

## 2017-02-14 MED ORDER — DIPHENHYDRAMINE HCL 25 MG PO CAPS
25.00 | ORAL_CAPSULE | ORAL | Status: DC
Start: ? — End: 2017-02-14

## 2017-02-14 MED ORDER — VERAPAMIL HCL 2.5 MG/ML IV SOLN
INTRAVENOUS | Status: DC
Start: ? — End: 2017-02-14

## 2017-02-14 MED ORDER — FERROUS SULFATE 325 (65 FE) MG PO TABS
325.00 | ORAL_TABLET | ORAL | Status: DC
Start: 2017-02-14 — End: 2017-02-14

## 2017-02-14 MED ORDER — METOCLOPRAMIDE HCL 5 MG/ML IJ SOLN
5.00 | INTRAMUSCULAR | Status: DC
Start: ? — End: 2017-02-14

## 2017-02-14 MED ORDER — POLYETHYLENE GLYCOL 3350 17 G PO PACK
17.00 | PACK | ORAL | Status: DC
Start: ? — End: 2017-02-14

## 2017-02-14 MED ORDER — SODIUM CHLORIDE 0.9 % IV SOLN
INTRAVENOUS | Status: DC
Start: ? — End: 2017-02-14

## 2017-02-14 MED ORDER — ASPIRIN 325 MG PO TABS
325.00 | ORAL_TABLET | ORAL | Status: DC
Start: 2017-02-13 — End: 2017-02-14

## 2017-02-14 MED ORDER — MORPHINE SULFATE (PF) 4 MG/ML IV SOLN
2.00 | INTRAVENOUS | Status: DC
Start: ? — End: 2017-02-14

## 2017-02-14 MED ORDER — FUROSEMIDE 20 MG PO TABS
20.00 | ORAL_TABLET | ORAL | Status: DC
Start: 2017-02-14 — End: 2017-02-14

## 2017-02-14 MED ORDER — PRAVASTATIN SODIUM 20 MG PO TABS
20.00 | ORAL_TABLET | ORAL | Status: DC
Start: ? — End: 2017-02-14

## 2017-02-14 MED ORDER — NITROGLYCERIN 0.4 MG SL SUBL
.40 | SUBLINGUAL_TABLET | SUBLINGUAL | Status: DC
Start: ? — End: 2017-02-14

## 2017-02-14 MED ORDER — CLOPIDOGREL BISULFATE 75 MG PO TABS
75.00 | ORAL_TABLET | ORAL | Status: DC
Start: 2017-02-14 — End: 2017-02-14

## 2017-02-14 MED ORDER — HYDRALAZINE HCL 25 MG PO TABS
50.00 | ORAL_TABLET | ORAL | Status: DC
Start: 2017-02-13 — End: 2017-02-14

## 2017-02-14 MED ORDER — ZOLPIDEM TARTRATE 5 MG PO TABS
5.00 | ORAL_TABLET | ORAL | Status: DC
Start: ? — End: 2017-02-14

## 2017-02-14 MED ORDER — GENERIC EXTERNAL MEDICATION
Status: DC
Start: ? — End: 2017-02-14

## 2017-02-14 MED ORDER — ACETAMINOPHEN 650 MG RE SUPP
650.00 | RECTAL | Status: DC
Start: ? — End: 2017-02-14

## 2017-02-14 MED ORDER — MIDAZOLAM HCL 2 MG/2ML IJ SOLN
INTRAMUSCULAR | Status: DC
Start: ? — End: 2017-02-14

## 2017-02-14 MED ORDER — ANTACID & ANTIGAS 200-200-20 MG/5ML PO SUSP
30.00 | ORAL | Status: DC
Start: ? — End: 2017-02-14

## 2017-02-14 MED ORDER — ALLOPURINOL 100 MG PO TABS
100.00 | ORAL_TABLET | ORAL | Status: DC
Start: 2017-02-13 — End: 2017-02-14

## 2017-02-14 MED ORDER — ENALAPRILAT 1.25 MG/ML IV INJ
1.25 | INJECTION | INTRAVENOUS | Status: DC
Start: ? — End: 2017-02-14

## 2017-02-14 MED ORDER — FENTANYL CITRATE (PF) 2500 MCG/50ML IJ SOLN
INTRAMUSCULAR | Status: DC
Start: ? — End: 2017-02-14

## 2017-02-14 MED ORDER — ACETAMINOPHEN 325 MG PO TABS
650.00 | ORAL_TABLET | ORAL | Status: DC
Start: ? — End: 2017-02-14

## 2017-02-14 MED ORDER — AMLODIPINE BESYLATE 10 MG PO TABS
10.00 | ORAL_TABLET | ORAL | Status: DC
Start: 2017-02-14 — End: 2017-02-14

## 2017-02-14 MED ORDER — LABETALOL HCL 5 MG/ML IV SOLN
20.00 | INTRAVENOUS | Status: DC
Start: ? — End: 2017-02-14

## 2017-02-14 MED ORDER — ENOXAPARIN SODIUM 40 MG/0.4ML ~~LOC~~ SOLN
40.00 | SUBCUTANEOUS | Status: DC
Start: 2017-02-14 — End: 2017-02-14

## 2017-02-14 MED ORDER — GENERIC EXTERNAL MEDICATION
0.30 | Status: DC
Start: ? — End: 2017-02-14

## 2017-02-14 MED ORDER — CARVEDILOL 6.25 MG PO TABS
6.25 | ORAL_TABLET | ORAL | Status: DC
Start: 2017-02-14 — End: 2017-02-14

## 2017-02-14 NOTE — ED Provider Notes (Signed)
[facility]    CSN: [id number] Arrival date & time: 02/12/17  1717     History   Chief Complaint Chief Complaint  Patient presents with  . Chest Pain    HPI Tonya Kaufman is a 77 y.o. female.   Patient complains of onset of vague discomfort in her left arm last night, and today developed vague pain in her left anterior chest.  No shortness of breath, nausea, or diaphoresis.   The history is provided by the patient.  Chest Pain  Pain location:  L chest Pain quality: aching   Pain radiates to:  Does not radiate Pain severity:  Mild Onset quality:  Gradual Duration:  1 day Timing:  Constant Progression:  Unchanged Chronicity:  New Context: at rest   Relieved by:  None tried Worsened by:  Nothing Ineffective treatments:  None tried Associated symptoms: no abdominal pain, no altered mental status, no back pain, no cough, no diaphoresis, no dizziness, no dysphagia, no fatigue, no fever, no headache, no heartburn, no nausea, no near-syncope, no palpitations, no PND, no shortness of breath, no syncope and no vomiting   Risk factors: coronary artery disease, diabetes mellitus, high cholesterol, hypertension and obesity     Past Medical History:  Diagnosis Date  . Anemia   . Arthritis   . CHF (congestive heart failure) (HCC)   . Chronic kidney disease (CKD), stage III (moderate) (HCC)   . Degenerative joint disease   . Depression   . Diabetes mellitus without complication (HCC)   . Esophageal reflux   . Gout   . HTN (hypertension)   . Hyperlipidemia   . Migraine   . Obesity   . Obstructive sleep apnea   . Renal disorder   . Sleep apnea   . TIA (transient ischemic attack)     Patient Active Problem List   Diagnosis Date Noted  . Right shoulder pain 04/28/2016  . Cervical spine pain 04/28/2016    Past Surgical History:  Procedure Laterality Date  . ABDOMINAL HYSTERECTOMY    . CHOLECYSTECTOMY    . JOINT REPLACEMENT    . REPLACEMENT TOTAL KNEE  BILATERAL      OB History    No data available       Home Medications    Prior to Admission medications   Medication Sig Start Date End Date Taking? Authorizing Provider  allopurinol (ZYLOPRIM) 300 MG tablet Take 300 mg by mouth daily.    [provider]  amLODipine (NORVASC) 10 MG tablet Take 10 mg by mouth daily.    [provider]  cloNIDine (CATAPRES) 0.3 MG tablet Take 0.3 mg by mouth 2 (two) times daily.    [provider]  clopidogrel (PLAVIX) 75 MG tablet Take 75 mg by mouth daily.    [provider]  diclofenac sodium (VOLTAREN) 1 % GEL Apply 2 g topically 4 (four) times daily. To affected joint. 04/28/16   Rodolph Bong, MD  fluticasone Aleda Grana) 50 MCG/ACT nasal spray 1 or 2 sprays each nostril twice a day 01/12/15   Lajean Manes, MD  furosemide (LASIX) 20 MG tablet Take 20 mg by mouth 2 (two) times daily.    [provider]  glimepiride (AMARYL) 4 MG tablet Take 4 mg by mouth daily with breakfast.    [provider]  hydrALAZINE (APRESOLINE) 25 MG tablet Take 25 mg by mouth 3 (three) times daily.    [provider]  HYDROcodone-acetaminophen (NORCO) 7.5-325 MG tablet Take 1  tablet by mouth every 6 (six) hours as needed for moderate pain. 05/19/16   Rodolph Bongorey, Evan S, MD  LORazepam (ATIVAN) 0.5 MG tablet Take 0.5 mg by mouth every 8 (eight) hours.    [provider]  losartan (COZAAR) 50 MG tablet Take 50 mg by mouth daily.    [provider]  magnesium oxide (MAG-OX) 400 MG tablet Take 400 mg by mouth daily.    [provider]  nebivolol (BYSTOLIC) 5 MG tablet Take 5 mg by mouth daily.    [provider]  pravastatin (PRAVACHOL) 20 MG tablet Take 20 mg by mouth daily.    [provider]  zolpidem (AMBIEN) 5 MG tablet Take 5 mg by mouth at bedtime as needed for sleep.    [provider]    Family History History reviewed. No pertinent family history.  Social  History Social History   Tobacco Use  . Smoking status: Never Smoker  . Smokeless tobacco: Never Used  Substance Use Topics  . Alcohol use: No  . Drug use: No     Allergies   Promethazine and Phentermine   Review of Systems Review of Systems  Constitutional: Negative for diaphoresis, fatigue and fever.  HENT: Negative for trouble swallowing.   Respiratory: Negative for cough and shortness of breath.   Cardiovascular: Positive for chest pain. Negative for palpitations, syncope, PND and near-syncope.  Gastrointestinal: Negative for abdominal pain, heartburn, nausea and vomiting.  Musculoskeletal: Negative for back pain.  Neurological: Negative for dizziness and headaches.  All other systems reviewed and are negative.    Physical Exam Triage Vital Signs ED Triage Vitals  Enc Vitals Group     BP 02/12/17 1722 (!) 162/67     Pulse Rate 02/12/17 1722 61     Resp 02/12/17 1722 18     Temp 02/12/17 1722 98.5 F (36.9 C)     Temp Source 02/12/17 1722 Oral     SpO2 02/12/17 1722 96 %     Weight --      Height --      Head Circumference --      Peak Flow --      Pain Score 02/12/17 1850 4     Pain Loc --      Pain Edu? --      Excl. in [place]? --    No data found.  Updated Vital Signs BP (!) 162/67 (BP Location: Right Arm)   Pulse 61   Temp 98.5 F (36.9 C) (Oral)   Resp 18   SpO2 96%   Visual Acuity Right Eye Distance:   Left Eye Distance:   Bilateral Distance:    Right Eye Near:   Left Eye Near:    Bilateral Near:     Physical Exam Nursing notes and Vital Signs reviewed. Appearance:  Patient appears stated age, and in no acute distress.    Eyes:  Pupils are equal, round, and reactive to light and accomodation.  Extraocular movement is intact.  Conjunctivae are not inflamed   Pharynx:  Normal; moist mucous membranes  Neck:  Supple.  No adenopathy Lungs:  Clear to auscultation.  Breath sounds are equal.  Moving air well. Heart:  Regular rate and rhythm  without murmurs, rubs, or gallops.  Abdomen:  Nontender without masses or hepatosplenomegaly.  Bowel sounds are present.  No CVA or flank tenderness.  Extremities:   Trace edema.   .     UC Treatments / Results  Labs (all  labs ordered are listed, but only abnormal results are displayed) Labs Reviewed - No data to display  EKG  EKG Interpretation  Rate:  58 BPM PR:  166 msec QT:  452 msec QTcH:  443 msec QRSD:  84 msec QRS axis:  -34 degrees Interpretation:   Sinus bradycardia.  ST elevation aVR, aVL       Radiology No results found.  Procedures Procedures (including critical care time)  Medications Ordered in UC Medications - No data to display   Initial Impression / Assessment and Plan / UC Course  I have reviewed the triage vital signs and the nursing notes.  Pertinent labs & imaging results that were available during my care of the patient were reviewed by me and considered in my medical decision making (see chart for details).    Patient administered NTG 0.4mg  SL and aspirin 324mg  PO; O2 administered by [place]. Patient transported [facility] ED by EMS.  Vital signs stable.    Final Clinical Impressions(s) / UC Diagnoses   Final diagnoses:  Chest pain due to myocardial ischemia, unspecified ischemic chest pain type    ED Discharge Orders    None           [name], MD 02/14/17 [phone]

## 2017-09-08 DIAGNOSIS — I25119 Atherosclerotic heart disease of native coronary artery with unspecified angina pectoris: Secondary | ICD-10-CM | POA: Insufficient documentation

## 2017-10-10 DIAGNOSIS — K5792 Diverticulitis of intestine, part unspecified, without perforation or abscess without bleeding: Secondary | ICD-10-CM | POA: Insufficient documentation

## 2018-08-05 DIAGNOSIS — I509 Heart failure, unspecified: Secondary | ICD-10-CM | POA: Insufficient documentation

## 2019-11-22 ENCOUNTER — Emergency Department (INDEPENDENT_AMBULATORY_CARE_PROVIDER_SITE_OTHER): Payer: Medicare Other

## 2019-11-22 ENCOUNTER — Encounter: Payer: Self-pay | Admitting: Emergency Medicine

## 2019-11-22 ENCOUNTER — Other Ambulatory Visit: Payer: Self-pay

## 2019-11-22 ENCOUNTER — Emergency Department (INDEPENDENT_AMBULATORY_CARE_PROVIDER_SITE_OTHER)
Admission: EM | Admit: 2019-11-22 | Discharge: 2019-11-22 | Disposition: A | Discharge status: Home / Self Care | Payer: Medicare Other | Source: Home / Self Care | Attending: Family Medicine | Admitting: Family Medicine

## 2019-11-22 DIAGNOSIS — M546 Pain in thoracic spine: Secondary | ICD-10-CM

## 2019-11-22 DIAGNOSIS — S29012A Strain of muscle and tendon of back wall of thorax, initial encounter: Secondary | ICD-10-CM

## 2019-11-22 NOTE — ED Provider Notes (Signed)
[facility]    CSN: [id number] Arrival date & time: 11/22/19  1549      History   Chief Complaint Chief Complaint  Patient presents with  . Back Pain    HPI Tonya Kaufman is a 79 y.o. female.   During the past two weeks patient has had aching pain in her right upper back, worse when taking a deep breath.  Yesterday the pain radiated intermittently to her right anterior chest.  She denies shortness of breath, and recalls no injury.  She denies cough and recent URI.  The history is provided by the patient.  Back Pain Pain location: right upper back. Quality:  Aching Radiates to: right anterior chest. Pain severity:  Mild Pain is:  Same all the time Onset quality:  Gradual Duration:  2 weeks Timing:  Intermittent Progression:  Worsening Chronicity:  New Context: twisting   Context: not lifting heavy objects, not recent illness and not recent injury   Relieved by:  None tried Worsened by:  Deep breathing and movement Ineffective treatments:  None tried Associated symptoms: no abdominal pain, no fever, no headaches, no leg pain, no numbness and no paresthesias   Risk factors: obesity     Past Medical History:  Diagnosis Date  . Anemia   . Arthritis   . CHF (congestive heart failure) ([place])   . Chronic kidney disease (CKD), stage III (moderate) (HCC)   . Degenerative joint disease   . Depression   . Diabetes mellitus without complication ([place])   . Esophageal reflux   . Gout   . HTN (hypertension)   . Hyperlipidemia   . Migraine   . Obesity   . Obstructive sleep apnea   . Renal disorder   . Sleep apnea   . TIA (transient ischemic attack)     Patient Active Problem List   Diagnosis Date Noted  . Congestive heart failure ([place]) 08/05/2018  . Diverticulitis 10/10/2017  . 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 Abnormal stress test 02/12/2017  . Varicose veins of leg with swelling, right 02/08/2017  . Right shoulder pain  04/28/2016  . Cervical spine pain 04/28/2016  . Hyperphosphatemia 10/13/2015  . Hypomagnesemia 10/13/2015  . 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 Anxiety 05/23/2013  . Chronic back pain 05/23/2013  . Hyperlipidemia 01/01/2013  . 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 DDD (degenerative disc disease), lumbosacral 12/14/2012  . Pain in joint, pelvic region and thigh 12/12/2012  . Essential hypertension 05/31/2012  . Vitamin D deficiency 03/23/2011  . Anemia in chronic kidney disease 12/08/2010  . Chronic kidney disease, stage III (moderate) ([place]) 12/08/2010  . Arthritis 12/03/2010  . Transient ischemic attack (TIA), and cerebral infarction without residual deficits(V12.54) 12/03/2010  . Esophageal reflux 10/05/2010  . Migraine 10/05/2010  . Obstructive sleep apnea (adult) (pediatric) 10/05/2010    Past Surgical History:  Procedure Laterality Date  . ABDOMINAL HYSTERECTOMY    . CHOLECYSTECTOMY    . JOINT REPLACEMENT    . REPLACEMENT TOTAL KNEE BILATERAL      OB History   No obstetric history on file.      Home Medications    Prior to Admission medications   Medication Sig Start Date End Date Taking? Authorizing Provider  allopurinol (ZYLOPRIM) 300 MG tablet Take 300 mg by mouth daily.   Yes [provider]  amLODipine (NORVASC) 10 MG tablet Take 10 mg by  mouth daily.   Yes [provider]  Blood Glucose Monitoring Suppl (GLUCOCOM BLOOD GLUCOSE MONITOR) DEVI 1 each by Misc.(Non-Drug; Combo Route) route 2 times daily. accu-chek aviva meter   Test 2 x daily  Dx E11.9 02/29/16  Yes [provider]  cloNIDine (CATAPRES) 0.3 MG tablet Take 0.3 mg by mouth 2 (two) times daily.   Yes [provider]  clopidogrel (PLAVIX) 75 MG tablet Take 75 mg by mouth daily.   Yes [provider]  dapagliflozin propanediol (FARXIGA) 5 MG TABS tablet Take by mouth. 11/22/19  Yes [provider]  ezetimibe (ZETIA) 10 MG tablet Take 1 tablet by mouth daily. 09/08/17  Yes [provider]  furosemide (LASIX) 20 MG tablet Take 20 mg by mouth 2 (two) times daily.   Yes [provider]  glipiZIDE (GLUCOTROL) 5 MG tablet Take by mouth. 08/24/18  Yes [provider]  hydrALAZINE (APRESOLINE) 25 MG tablet Take 25 mg by mouth 3 (three) times daily.   Yes [provider]  HYDROcodone-acetaminophen (NORCO) 7.5-325 MG tablet Take 1 tablet by mouth every 6 (six) hours as needed for moderate pain. 05/19/16  Yes Gregor Hams, MD  linagliptin (TRADJENTA) 5 MG TABS tablet Take by mouth. 08/20/19  Yes [provider]  losartan (COZAAR) 50 MG tablet Take 50 mg by mouth daily.   Yes [provider]  metolazone (ZAROXOLYN) 5 MG tablet Take by mouth. 08/07/18  Yes [provider]  naloxone (NARCAN) 4 MG/0.1ML LIQD nasal spray kit  04/10/19  Yes [provider]  nebivolol (BYSTOLIC) 5 MG tablet Take 5 mg by mouth daily.   Yes [provider]  pravastatin (PRAVACHOL) 20 MG tablet Take 20 mg by mouth daily.   Yes [provider]  diclofenac sodium (VOLTAREN) 1 % GEL Apply 2 g topically 4 (four) times daily. To affected joint. Patient not taking: Reported on 11/22/2019 04/28/16   Gregor Hams, MD  ferrous sulfate 325 (65 FE) MG tablet Take by mouth.    [provider]  fluticasone (FLONASE) 50 MCG/ACT nasal spray 1 or 2 sprays each nostril twice a day Patient not taking: Reported on 11/22/2019 01/12/15   Jacqulyn Cane, MD  glimepiride (AMARYL) 4 MG tablet Take 4 mg by mouth daily with breakfast. Patient not taking: Reported on 11/22/2019    [provider]  glucose blood test strip 1 each by Other route as needed for Other. Use as instructed    [provider]  LORazepam (ATIVAN) 0.5 MG tablet Take 0.5 mg by mouth every 8 (eight) hours. Patient not taking: Reported on 11/22/2019     [provider]  magnesium oxide (MAG-OX) 400 MG tablet Take 400 mg by mouth daily.    [provider]  zolpidem (AMBIEN) 5 MG tablet Take 5 mg by mouth at bedtime as needed for sleep. Patient not taking: Reported on 11/22/2019    [provider]    Family History Family History  Problem Relation Age of Onset  . Diabetes Mother   . Hypertension Mother   . Stroke Mother   . Healthy Father   . Pancreatic cancer Sister   . Healthy Brother   . Diabetes Sister   . Healthy Sister     Social History Social History   Tobacco Use  . Smoking status: Never Smoker  . Smokeless tobacco: Never Used  Substance Use Topics  . Alcohol use: No  . Drug use: No  Allergies   Lisinopril, Promethazine, and Phentermine   Review of Systems Review of Systems  Constitutional: Negative for activity change, appetite change, chills, diaphoresis, fatigue and fever.  HENT: Negative.   Eyes: Negative.   Respiratory: Positive for chest tightness. Negative for cough, shortness of breath, wheezing and stridor.   Cardiovascular: Negative for palpitations and leg swelling.  Gastrointestinal: Negative for abdominal pain.  Genitourinary: Negative.   Musculoskeletal: Positive for back pain.  Skin: Negative for rash.  Neurological: Negative for numbness, headaches and paresthesias.  Hematological: Negative for adenopathy.     Physical Exam Triage Vital Signs ED Triage Vitals  Enc Vitals Group     BP 11/22/19 1701 (!) 180/81     Pulse Rate 11/22/19 1701 69     Resp 11/22/19 1701 20     Temp 11/22/19 1701 99.5 F (37.5 C)     Temp Source 11/22/19 1701 Oral     SpO2 11/22/19 1701 93 %     Weight 11/22/19 1706 289 lb (131.1 kg)     Height 11/22/19 1706 _0  (1.575 m)     Head Circumference --      Peak Flow --      Pain Score 11/22/19 1703 4     Pain Loc --      Pain Edu? --      Excl. in [place]? --    No data found.  Updated Vital Signs BP (!) 180/81 (BP  Location: Right Arm)   Pulse 69   Temp 99.5 F (37.5 C) (Oral)   Resp 20   Ht _1  (1.575 m)   Wt 131.1 kg   SpO2 93%   BMI 52.86 kg/m   Visual Acuity Right Eye Distance:   Left Eye Distance:   Bilateral Distance:    Right Eye Near:   Left Eye Near:    Bilateral Near:     Physical Exam Vitals and nursing note reviewed.  Constitutional:      General: She is not in acute distress.    Appearance: She is obese.  HENT:     Head: Normocephalic.     Right Ear: External ear normal.     Left Ear: External ear normal.     Nose: Nose normal.     Mouth/Throat:     Pharynx: Oropharynx is clear.  Eyes:     Conjunctiva/sclera: Conjunctivae normal.     Pupils: Pupils are equal, round, and reactive to light.  Cardiovascular:     Rate and Rhythm: Normal rate and regular rhythm.     Heart sounds: Normal heart sounds.  Pulmonary:     Effort: No respiratory distress.     Breath sounds: Normal breath sounds. No wheezing, rhonchi or rales.       Comments: There is distinct tenderness over medial and inferior edges of right scapula.  Pain elicited by resisted abduction of right shoulder while palpating right rhomboid muscles.  Chest:     Chest wall: No tenderness.  Abdominal:     Palpations: Abdomen is soft.     Tenderness: There is no abdominal tenderness.  Musculoskeletal:        General: No tenderness.     Cervical back: Neck supple.  Lymphadenopathy:     Cervical: No cervical adenopathy.  Skin:    General: Skin is warm and dry.     Findings: No rash.  Neurological:     Mental Status: She is alert and oriented to person, place, and time.  UC Treatments / Results  Labs (all labs ordered are listed, but only abnormal results are displayed) Labs Reviewed - No data to display  EKG   Radiology DG Chest 2 View  Result Date: 11/22/2019 CLINICAL DATA:  Mid upper back pain for 1 week, hypoxia EXAM: CHEST - 2 VIEW COMPARISON:  12/27/2012 FINDINGS: Frontal and lateral  views of the chest demonstrate a stable cardiac silhouette. No airspace disease, effusion, or pneumothorax. No acute bony abnormalities. IMPRESSION: 1. Stable exam, no acute process. Electronically Signed   By: Randa Ngo M.D.   On: 11/22/2019 18:09    Procedures Procedures (including critical care time)  Medications Ordered in UC Medications - No data to display  Initial Impression / Assessment and Plan / UC Course  I have reviewed the triage vital signs and the nursing notes.  Pertinent labs & imaging results that were available during my care of the patient were reviewed by me and considered in my medical decision making (see chart for details).    Note no acute changes on chest x-ray. Followup with Dr. Aundria Mems ([facility]) if not improving about two weeks.    Final Clinical Impressions(s) / UC Diagnoses   Final diagnoses:  Rhomboid muscle strain, initial encounter     Discharge Instructions     Apply ice pack beneath right shoulder blade for 20 to 30 minutes, 3 to 4 times daily  Continue until pain and swelling decrease.  Begin range of motion and stretching exercises as tolerated.    ED Prescriptions    None        Kandra Nicolas, MD 11/24/19 2145

## 2019-11-22 NOTE — Discharge Instructions (Addendum)
Apply ice pack beneath right shoulder blade for 20 to 30 minutes, 3 to 4 times daily  Continue until pain and swelling decrease.  Begin range of motion and stretching exercises as tolerated.

## 2019-11-22 NOTE — ED Triage Notes (Signed)
Upper back pain x 1 week - worse  when taking a deep breath Pt saw her endocrinologist thi morning but didn't mention her back pain  O2 sats 93% in triage  Increased fatigue  No COVID vaccine

## 2020-02-11 DEATH — deceased

## 2022-09-21 IMAGING — DX DG CHEST 2V
2 series · 2 of 2 positions shown · non-contrast
Comparison: 12/27/2012

CLINICAL DATA: Mid upper back pain for 1 week, hypoxia

EXAM:
CHEST - 2 VIEW

[chest pa]
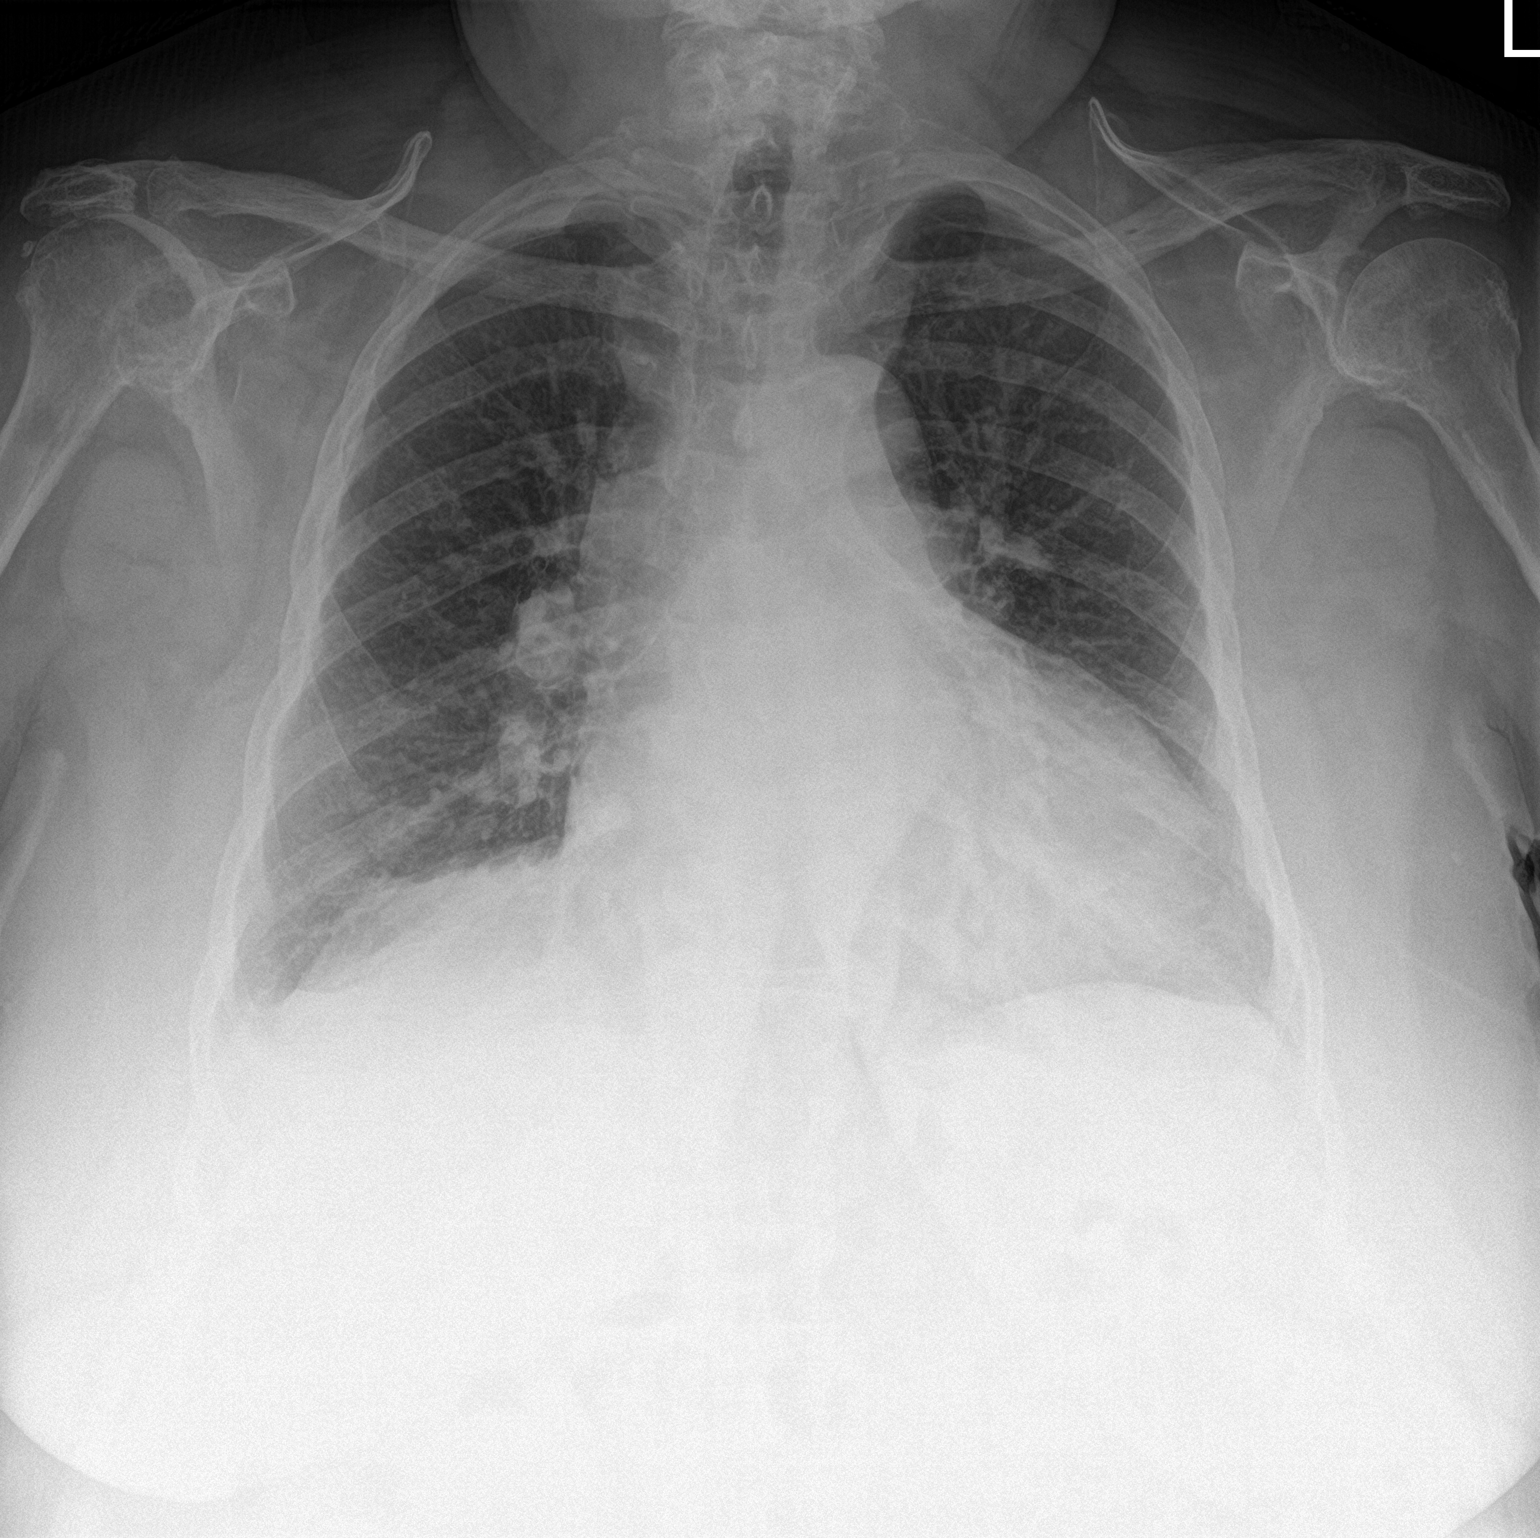

[chest lat]
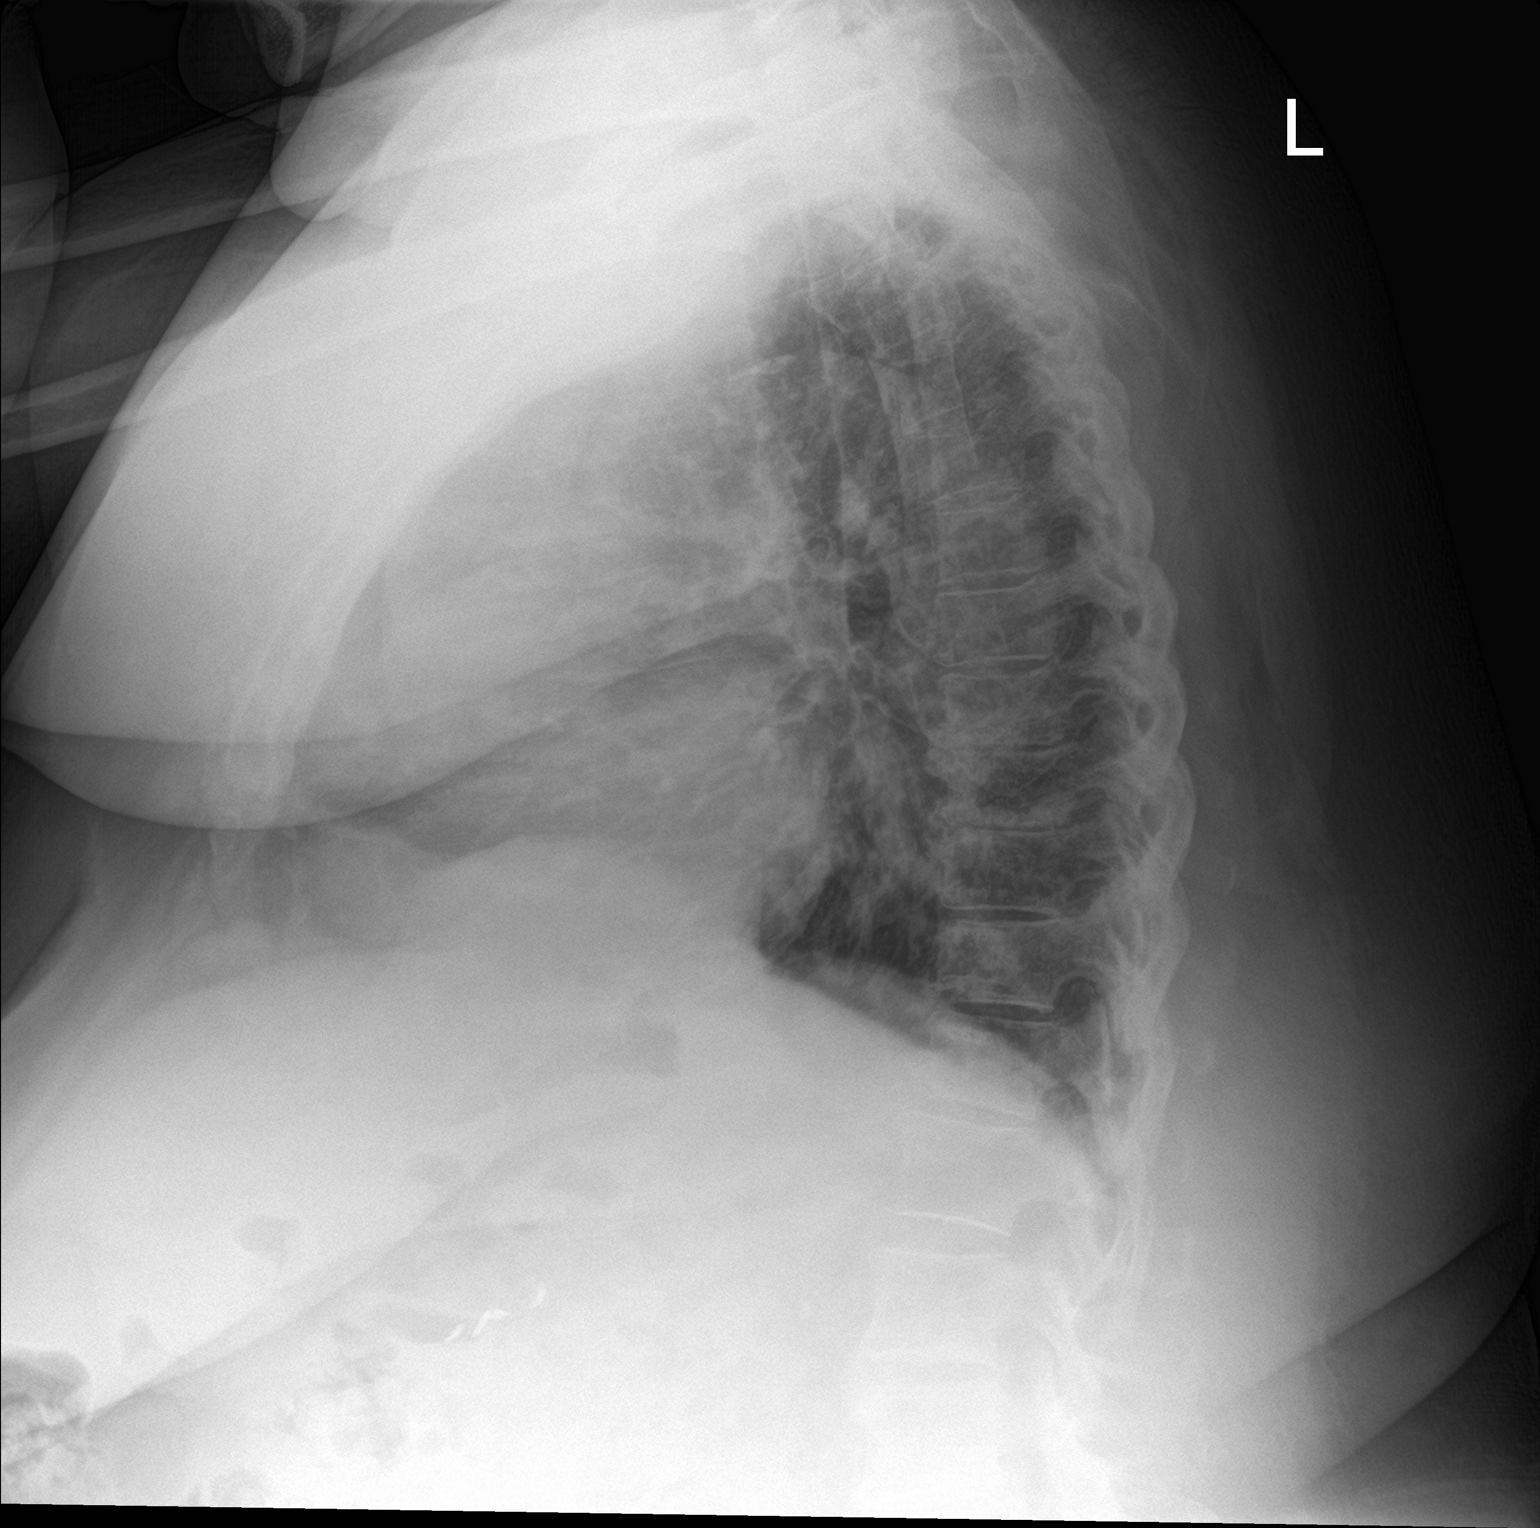

[2 of 2 positions shown; findings below may reference images not displayed]

FINDINGS: Frontal and lateral views of the chest demonstrate a stable cardiac
silhouette. No airspace disease, effusion, or pneumothorax. No acute
bony abnormalities.
IMPRESSION: 1. Stable exam, no acute process.
